# Patient Record
Sex: Female | Born: 1980 | Race: White | Hispanic: Yes | Marital: Married | Smoking: Never smoker
Health system: Southern US, Community
[De-identification: ages and names within clinical notes are randomized; demographics above are authoritative.]

## PROBLEM LIST (undated history)

## (undated) HISTORY — PX: CHOLECYSTECTOMY: SHX55

---

## 2000-12-08 ENCOUNTER — Encounter: Admission: RE | Admit: 2000-12-08 | Discharge: 2000-12-08 | Payer: Self-pay | Admitting: Obstetrics & Gynecology

## 2000-12-29 ENCOUNTER — Encounter: Admission: RE | Admit: 2000-12-29 | Discharge: 2000-12-29 | Payer: Self-pay | Admitting: Obstetrics & Gynecology

## 2001-01-08 ENCOUNTER — Ambulatory Visit (HOSPITAL_COMMUNITY): Admission: RE | Admit: 2001-01-08 | Discharge: 2001-01-08 | Payer: Self-pay | Admitting: Obstetrics & Gynecology

## 2001-01-25 ENCOUNTER — Ambulatory Visit (HOSPITAL_COMMUNITY): Admission: RE | Admit: 2001-01-25 | Discharge: 2001-01-25 | Payer: Self-pay | Admitting: Obstetrics & Gynecology

## 2001-06-02 ENCOUNTER — Ambulatory Visit (HOSPITAL_COMMUNITY): Admission: RE | Admit: 2001-06-02 | Discharge: 2001-06-02 | Payer: Self-pay | Admitting: *Deleted

## 2001-08-12 ENCOUNTER — Ambulatory Visit (HOSPITAL_COMMUNITY): Admission: RE | Admit: 2001-08-12 | Discharge: 2001-08-12 | Payer: Self-pay | Admitting: *Deleted

## 2001-10-29 ENCOUNTER — Ambulatory Visit (HOSPITAL_COMMUNITY): Admission: RE | Admit: 2001-10-29 | Discharge: 2001-10-29 | Payer: Self-pay | Admitting: *Deleted

## 2001-11-02 ENCOUNTER — Inpatient Hospital Stay (HOSPITAL_COMMUNITY): Admission: AD | Admit: 2001-11-02 | Discharge: 2001-11-02 | Payer: Self-pay | Admitting: *Deleted

## 2001-11-02 ENCOUNTER — Encounter: Payer: Self-pay | Admitting: *Deleted

## 2001-11-09 ENCOUNTER — Inpatient Hospital Stay (HOSPITAL_COMMUNITY): Admission: AD | Admit: 2001-11-09 | Discharge: 2001-11-11 | Payer: Self-pay | Admitting: *Deleted

## 2006-07-02 ENCOUNTER — Emergency Department (HOSPITAL_COMMUNITY): Admission: EM | Admit: 2006-07-02 | Discharge: 2006-07-02 | Payer: Self-pay | Admitting: Emergency Medicine

## 2006-11-28 ENCOUNTER — Emergency Department (HOSPITAL_COMMUNITY): Admission: EM | Admit: 2006-11-28 | Discharge: 2006-11-28 | Payer: Self-pay | Admitting: Emergency Medicine

## 2007-12-17 ENCOUNTER — Ambulatory Visit: Payer: Self-pay | Admitting: Gynecology

## 2007-12-20 ENCOUNTER — Ambulatory Visit (HOSPITAL_COMMUNITY): Admission: RE | Admit: 2007-12-20 | Discharge: 2007-12-20 | Payer: Self-pay | Admitting: Obstetrics & Gynecology

## 2007-12-24 ENCOUNTER — Ambulatory Visit: Payer: Self-pay | Admitting: Gynecology

## 2008-08-20 ENCOUNTER — Inpatient Hospital Stay (HOSPITAL_COMMUNITY): Admission: AD | Admit: 2008-08-20 | Discharge: 2008-08-20 | Payer: Self-pay | Admitting: Obstetrics & Gynecology

## 2008-09-28 ENCOUNTER — Ambulatory Visit (HOSPITAL_COMMUNITY): Admission: RE | Admit: 2008-09-28 | Discharge: 2008-09-28 | Payer: Self-pay | Admitting: Family Medicine

## 2008-12-19 ENCOUNTER — Inpatient Hospital Stay (HOSPITAL_COMMUNITY): Admission: AD | Admit: 2008-12-19 | Discharge: 2008-12-21 | Payer: Self-pay | Admitting: Obstetrics & Gynecology

## 2008-12-19 ENCOUNTER — Ambulatory Visit: Payer: Self-pay | Admitting: Family Medicine

## 2009-12-25 ENCOUNTER — Emergency Department (HOSPITAL_COMMUNITY): Admission: EM | Admit: 2009-12-25 | Discharge: 2009-12-25 | Payer: Self-pay | Admitting: Family Medicine

## 2009-12-25 ENCOUNTER — Emergency Department (HOSPITAL_COMMUNITY): Admission: EM | Admit: 2009-12-25 | Discharge: 2009-12-25 | Payer: Self-pay | Admitting: Emergency Medicine

## 2010-06-05 ENCOUNTER — Ambulatory Visit: Payer: Self-pay | Admitting: Internal Medicine

## 2010-06-05 ENCOUNTER — Encounter (INDEPENDENT_AMBULATORY_CARE_PROVIDER_SITE_OTHER): Payer: Self-pay | Admitting: Family Medicine

## 2010-06-05 LAB — CONVERTED CEMR LAB
Albumin: 4.7 g/dL (ref 3.5–5.2)
Basophils Relative: 0 % (ref 0–1)
Calcium: 10.2 mg/dL (ref 8.4–10.5)
Creatinine, Ser: 0.72 mg/dL (ref 0.40–1.20)
Crystals: NONE SEEN
Eosinophils Absolute: 0.2 10*3/uL (ref 0.0–0.7)
Lipase: 15 units/L (ref 0–75)
Lymphs Abs: 3.9 10*3/uL (ref 0.7–4.0)
MCHC: 32.8 g/dL (ref 30.0–36.0)
MCV: 87.4 fL (ref 78.0–100.0)
Neutrophils Relative %: 58 % (ref 43–77)
Platelets: 259 10*3/uL (ref 150–400)
RBC / HPF: NONE SEEN (ref <3)
WBC: 10.8 10*3/uL — ABNORMAL HIGH (ref 4.0–10.5)

## 2010-06-06 ENCOUNTER — Encounter (INDEPENDENT_AMBULATORY_CARE_PROVIDER_SITE_OTHER): Payer: Self-pay | Admitting: Family Medicine

## 2010-07-08 ENCOUNTER — Ambulatory Visit: Payer: Self-pay | Admitting: Internal Medicine

## 2010-07-12 ENCOUNTER — Ambulatory Visit (HOSPITAL_COMMUNITY): Admission: RE | Admit: 2010-07-12 | Discharge: 2010-07-12 | Payer: Self-pay | Admitting: Internal Medicine

## 2010-08-26 ENCOUNTER — Emergency Department (HOSPITAL_COMMUNITY): Admission: EM | Admit: 2010-08-26 | Discharge: 2010-08-26 | Payer: Self-pay | Admitting: Family Medicine

## 2010-10-02 ENCOUNTER — Ambulatory Visit (HOSPITAL_COMMUNITY): Admission: RE | Admit: 2010-10-02 | Discharge: 2010-10-02 | Payer: Self-pay | Admitting: General Surgery

## 2011-02-12 LAB — URINALYSIS, ROUTINE W REFLEX MICROSCOPIC
Bilirubin Urine: NEGATIVE
Ketones, ur: NEGATIVE mg/dL
Leukocytes, UA: NEGATIVE
Nitrite: NEGATIVE
Specific Gravity, Urine: 1.007 (ref 1.005–1.030)
Urobilinogen, UA: 0.2 mg/dL (ref 0.0–1.0)
pH: 6 (ref 5.0–8.0)

## 2011-02-12 LAB — COMPREHENSIVE METABOLIC PANEL
ALT: 14 U/L (ref 0–35)
Alkaline Phosphatase: 41 U/L (ref 39–117)
BUN: 13 mg/dL (ref 6–23)
CO2: 24 mEq/L (ref 19–32)
Calcium: 9.7 mg/dL (ref 8.4–10.5)
GFR calc Af Amer: >60 mL/min (ref >60)
GFR calc non Af Amer: >60 mL/min (ref >60)
Glucose, Bld: 81 mg/dL (ref 70–99)
Total Protein: 7.2 g/dL (ref 6.0–8.3)

## 2011-02-12 LAB — DIFFERENTIAL
Basophils Absolute: 0 10*3/uL (ref 0.0–0.1)
Eosinophils Relative: 2 % (ref 0–5)
Lymphocytes Relative: 30 % (ref 12–46)
Lymphs Abs: 2.2 10*3/uL (ref 0.7–4.0)
Monocytes Absolute: 0.3 10*3/uL (ref 0.1–1.0)
Neutro Abs: 4.6 10*3/uL (ref 1.7–7.7)

## 2011-02-12 LAB — URINE MICROSCOPIC-ADD ON

## 2011-02-12 LAB — CBC
HCT: 39.6 % (ref 36.0–46.0)
Hemoglobin: 13.3 g/dL (ref 12.0–15.0)
RDW: 13.4 % (ref 11.5–15.5)
WBC: 7.3 10*3/uL (ref 4.0–10.5)

## 2011-02-12 LAB — SURGICAL PCR SCREEN
MRSA, PCR: NEGATIVE
Staphylococcus aureus: NEGATIVE

## 2011-02-12 LAB — PREGNANCY, URINE: Preg Test, Ur: NEGATIVE

## 2011-02-13 LAB — LIPASE, BLOOD: Lipase: 21 U/L (ref 11–59)

## 2011-02-13 LAB — POCT URINALYSIS DIPSTICK
Bilirubin Urine: NEGATIVE
Glucose, UA: NEGATIVE mg/dL
Nitrite: NEGATIVE
Urobilinogen, UA: 0.2 mg/dL (ref 0.0–1.0)

## 2011-02-13 LAB — HEPATIC FUNCTION PANEL
ALT: 13 U/L (ref 0–35)
Total Protein: 6.5 g/dL (ref 6.0–8.3)

## 2011-02-13 LAB — AMYLASE: Amylase: 79 U/L (ref 0–105)

## 2011-02-13 LAB — POCT PREGNANCY, URINE: Preg Test, Ur: NEGATIVE

## 2011-02-16 LAB — CBC
HCT: 37.1 % (ref 36.0–46.0)
Hemoglobin: 12.6 g/dL (ref 12.0–15.0)
Platelets: 213 10*3/uL (ref 150–400)
WBC: 12.6 10*3/uL — ABNORMAL HIGH (ref 4.0–10.5)

## 2011-02-16 LAB — DIFFERENTIAL
Eosinophils Absolute: 0.1 10*3/uL (ref 0.0–0.7)
Lymphocytes Relative: 35 % (ref 12–46)
Lymphs Abs: 4.4 10*3/uL — ABNORMAL HIGH (ref 0.7–4.0)
Neutro Abs: 7.4 10*3/uL (ref 1.7–7.7)
Neutrophils Relative %: 59 % (ref 43–77)

## 2011-02-16 LAB — COMPREHENSIVE METABOLIC PANEL
AST: 19 U/L (ref 0–37)
Albumin: 4.1 g/dL (ref 3.5–5.2)
Alkaline Phosphatase: 50 U/L (ref 39–117)
BUN: 13 mg/dL (ref 6–23)
CO2: 22 mEq/L (ref 19–32)
Chloride: 110 mEq/L (ref 96–112)
Creatinine, Ser: 0.6 mg/dL (ref 0.4–1.2)
GFR calc non Af Amer: >60 mL/min (ref >60)
Potassium: 3.7 mEq/L (ref 3.5–5.1)
Total Bilirubin: 0.4 mg/dL (ref 0.3–1.2)

## 2011-02-16 LAB — LIPASE, BLOOD: Lipase: 21 U/L (ref 11–59)

## 2011-03-17 LAB — CBC
HCT: 34.1 % — ABNORMAL LOW (ref 36.0–46.0)
Platelets: 188 10*3/uL (ref 150–400)
RDW: 15.5 % (ref 11.5–15.5)

## 2011-03-17 LAB — RPR: RPR Ser Ql: NONREACTIVE

## 2011-04-15 NOTE — Group Therapy Note (Signed)
NAMELAYCI, STENGLEIN NO.:  [id number]   MEDICAL RECORD NO.:  [id number]          PATIENT TYPE:  WOC   LOCATION:  [facility]                   FACILITY:  [facility]   PHYSICIAN:  Caren Griffins, CNM       DATE OF BIRTH:  1981/02/29   DATE OF SERVICE:                                  CLINIC NOTE   REASON FOR VISIT:  Pelvic pain.   HISTORY:  This is a 30 year old G3, P3 who last delivered 4 years ago.  She reports that since that time she has experienced dyspareunia and  dysmenorrhea which has been gradually worsening.  The dyspareunia is  experienced as insertion pain in the vagina as well as suprapubic pain  that has made intercourse impossible and she is consequently separate  from her partner.  She also experiences pelvic sharp pain that she  describes as not crampy but at times incapacitating during menstruation.  She denies having had dysmenorrhea prior to her deliveries and has had  no surgeries.  She has tried Tylenol only for the pain.  She [facility] [place] [facility].  She  had negative GC and chlamydia at that visit.  She had a negative Pap  smear 2 months ago.  She is interested in IUD for the future.   ALLERGIES:  NONE.   IMMUNIZATIONS:  Usual childhood immunizations.   MENSTRUAL HISTORY:  Is 14 x 28 x 3, heavy with severe pain.   CONTRACEPTIVE HISTORY:  Foam and condoms.   PARTNERS:  None at present.   OB HISTORY:  SVD x3, uncomplicated, at term.  LMP was December 14, 2007.  She is not bleeding now.   GYN HISTORY:  Negative for abnormal Pap or STIs.   SURGERIES:  None.   FAMILY HISTORY:  She has checked off as negative.   PERSONAL MEDICAL HISTORY:  Noncontributory.   SOCIAL HISTORY:  Lives with her children.  She works outside the home.  Negative tobacco, alcohol, drug use.  Denies history of being abused.   REVIEW OF SYSTEMS:  She is positive for fatigue, frequent headaches,  problems with  visual acuity, occasional vomiting.  She denies any  difficulties with urination including dysuria, urgency, frequency,  hematuria.  She also denies bowel problems since she was treated with  Surfak for her constipation.  The pain is unrelieved by bowel movement.  At present she has no other concerns.   PHYSICAL EXAMINATION:  Temperature 97.6, blood pressure 97/72, weight  193.  GENERAL:  Overweight Hispanic female in no apparent distress, normal  affect.  Full physical exam is not repeated as this was done at [facility] visit.  However the bimanual exam and abdominal exam are  done.  ABDOMEN:  Obese and benign, no organomegaly.  She does have tenderness  to light and deep palpation suprapubicly and it is of a diffuse nature.  BIMANUAL:  __________, tanner five, normal rugae.  Cervix is nontender  with cervical motion, however she is tender with insertion of digits.  The cervix is mid position to anterior, feels multiparous and smooth.  Uterus, difficult  to outline due to body habitus but is at least  somewhat retroverted, retroflex and diffusely tender, no enlargement  appreciated, no adnexal masses appreciated however she is also diffusely  tender on the adnexal exam.   ASSESSMENT:  Long standing probable functional pelvic pain,  dysmenorrhea.   PLAN:  Will proceed with pelvic ultrasound to rule out organic reasons.  This could represent endometriosis or pelvic congestion syndrome.  Mirena IUD would be a good choice is pelvic ultrasound is negative and  will proceed with that after the ultrasound.  They are unable to do that  today so schedule for December 20, 2007 at 11:15 and then following that  the patient will return to GYN clinic.           ______________________________  Caren Griffins, CNM     DP/MEDQ  D:  12/17/2007  T:  12/17/2007  Job:  [id number]

## 2011-09-01 LAB — CBC
MCV: 90.4
Platelets: 220
WBC: 9.2

## 2011-09-01 LAB — GC/CHLAMYDIA PROBE AMP, GENITAL: GC Probe Amp, Genital: NEGATIVE

## 2011-09-01 LAB — RPR: RPR Ser Ql: NONREACTIVE

## 2011-09-01 LAB — URINALYSIS, ROUTINE W REFLEX MICROSCOPIC
Hgb urine dipstick: NEGATIVE
Nitrite: NEGATIVE
Protein, ur: NEGATIVE
Urobilinogen, UA: 0.2

## 2011-09-01 LAB — WET PREP, GENITAL
Trich, Wet Prep: NONE SEEN
Yeast Wet Prep HPF POC: NONE SEEN

## 2011-09-01 LAB — HEPATITIS B SURFACE ANTIGEN: Hepatitis B Surface Ag: NEGATIVE

## 2011-09-01 LAB — TYPE AND SCREEN: ABO/RH(D): O POS

## 2011-09-01 LAB — DIFFERENTIAL
Basophils Relative: 0
Eosinophils Absolute: 0
Lymphs Abs: 2.1
Neutrophils Relative %: 71

## 2012-04-27 ENCOUNTER — Emergency Department (HOSPITAL_COMMUNITY)
Admission: EM | Admit: 2012-04-27 | Discharge: 2012-04-28 | Disposition: A | Discharge status: Home / Self Care | Payer: Self-pay | Attending: Emergency Medicine | Admitting: Emergency Medicine

## 2012-04-27 ENCOUNTER — Encounter (HOSPITAL_COMMUNITY): Payer: Self-pay | Admitting: Emergency Medicine

## 2012-04-27 DIAGNOSIS — R110 Nausea: Secondary | ICD-10-CM | POA: Insufficient documentation

## 2012-04-27 DIAGNOSIS — R109 Unspecified abdominal pain: Secondary | ICD-10-CM | POA: Insufficient documentation

## 2012-04-27 LAB — URINALYSIS, ROUTINE W REFLEX MICROSCOPIC
Bilirubin Urine: NEGATIVE
Ketones, ur: NEGATIVE mg/dL
Protein, ur: NEGATIVE mg/dL
Urobilinogen, UA: 0.2 mg/dL (ref 0.0–1.0)

## 2012-04-27 LAB — URINE MICROSCOPIC-ADD ON

## 2012-04-27 NOTE — ED Notes (Signed)
Patient with abdominal pain that started yesterday, having nausea, no vomiting.  No vaginal complaints.

## 2012-04-28 LAB — CBC
HCT: 36.5 % (ref 36.0–46.0)
Hemoglobin: 12.2 g/dL (ref 12.0–15.0)
RDW: 13.3 % (ref 11.5–15.5)
WBC: 11.1 10*3/uL — ABNORMAL HIGH (ref 4.0–10.5)

## 2012-04-28 LAB — POCT I-STAT, CHEM 8
BUN: 18 mg/dL (ref 6–23)
Calcium, Ion: 1.22 mmol/L (ref 1.12–1.32)
Chloride: 107 mEq/L (ref 96–112)
Potassium: 4.1 mEq/L (ref 3.5–5.1)

## 2012-04-28 LAB — WET PREP, GENITAL
Clue Cells Wet Prep HPF POC: NONE SEEN
Trich, Wet Prep: NONE SEEN
Yeast Wet Prep HPF POC: NONE SEEN

## 2012-04-28 LAB — DIFFERENTIAL
Basophils Absolute: 0 10*3/uL (ref 0.0–0.1)
Lymphocytes Relative: 16 % (ref 12–46)
Monocytes Absolute: 0.6 10*3/uL (ref 0.1–1.0)
Monocytes Relative: 6 % (ref 3–12)
Neutro Abs: 8.5 10*3/uL — ABNORMAL HIGH (ref 1.7–7.7)

## 2012-04-28 MED ORDER — OXYCODONE-ACETAMINOPHEN 5-325 MG PO TABS
1.0000 | ORAL_TABLET | Freq: Once | ORAL | Status: AC
  Administered 2012-04-28: 1 via ORAL
  Filled 2012-04-28: qty 1

## 2012-04-28 MED ORDER — CEFTRIAXONE SODIUM 250 MG IJ SOLR
250.0000 mg | Freq: Once | INTRAMUSCULAR | Status: AC
  Administered 2012-04-28: 250 mg via INTRAMUSCULAR
  Filled 2012-04-28: qty 250

## 2012-04-28 MED ORDER — OXYCODONE-ACETAMINOPHEN 5-325 MG PO TABS: 1.0000 | ORAL_TABLET | ORAL | Status: AC | PRN

## 2012-04-28 MED ORDER — LIDOCAINE HCL (PF) 1 % IJ SOLN
INTRAMUSCULAR | Status: AC
  Filled 2012-04-28: qty 5

## 2012-04-28 MED ORDER — DOXYCYCLINE HYCLATE 100 MG PO CAPS: 100.0000 mg | ORAL_CAPSULE | Freq: Two times a day (BID) | ORAL | Status: AC

## 2012-04-28 MED ORDER — ONDANSETRON 4 MG PO TBDP
8.0000 mg | ORAL_TABLET | Freq: Once | ORAL | Status: AC
  Administered 2012-04-28: 8 mg via ORAL
  Filled 2012-04-28: qty 2

## 2012-04-28 NOTE — ED Provider Notes (Signed)
History     CSN: [id number]  Arrival date & time 04/27/12  2121   First MD Initiated Contact with Patient 04/28/12 0054      Chief Complaint  Patient presents with  . Abdominal Pain     Patient is a 31 y.o. female presenting with abdominal pain. The history is provided by the patient and a relative. A language interpreter was used Psychologist, prison and probation services interpreter phone utilized).  Abdominal Pain The primary symptoms of the illness include abdominal pain and nausea. The primary symptoms of the illness do not include fever, vomiting, diarrhea, dysuria, vaginal discharge or vaginal bleeding. The current episode started yesterday. The onset of the illness was gradual. The problem has been gradually worsening.  The patient states that she believes she is currently not pregnant. Additional symptoms associated with the illness include back pain. Symptoms associated with the illness do not include frequency.  pt reports she had headache two days ago, and now with abdominal pain for past 24 hours No fever, no vomiting, no diarrhea is reported She reports she has not experienced these symptoms previously  PMH - none  Past Surgical History  Procedure Date  . Cholecystectomy     History reviewed. No pertinent family history.  History  Substance Use Topics  . Smoking status: Never Smoker   . Smokeless tobacco: Not on file  . Alcohol Use: No    OB History    Grav Para Term Preterm Abortions TAB SAB Ect Mult Living                  Review of Systems  Constitutional: Negative for fever.  Gastrointestinal: Positive for nausea and abdominal pain. Negative for vomiting and diarrhea.  Genitourinary: Negative for dysuria, frequency, vaginal bleeding and vaginal discharge.  Musculoskeletal: Positive for back pain.  All other systems reviewed and are negative.    Allergies  Review of patient's allergies indicates no known allergies.  Home Medications  No current outpatient prescriptions on  file.  BP 109/67  Pulse 75  Temp(Src) 98.8 F (37.1 C) (Oral)  Resp 19  SpO2 100%  LMP 03/31/2012  Physical Exam CONSTITUTIONAL: Well developed/well nourished HEAD AND FACE: Normocephalic/atraumatic EYES: EOMI/PERRL, no scleral icterus ENMT: Mucous membranes moist NECK: supple no meningeal signs SPINE:entire spine nontender CV: S1/S2 noted, no murmurs/rubs/gallops noted LUNGS: Lungs are clear to auscultation bilaterally, no apparent distress ABDOMEN: soft, nontender, no rebound or guarding GU:no cva tenderness String noted at os (IUD), +CMT noted, mild bilateral adnexal tenderness.  Vaginal discharge noted.  No vag bleeding Chaperone present NEURO: Pt is awake/alert, moves all extremitiesx4 EXTREMITIES: pulses normal, full ROM SKIN: warm, color normal PSYCH: no abnormalities of mood noted  ED Course  Procedures   Labs Reviewed  URINALYSIS, ROUTINE W REFLEX MICROSCOPIC - Abnormal; Notable for the following:    Color, Urine STRAW (*)    Hgb urine dipstick TRACE (*)    Leukocytes, UA TRACE (*)    All other components within normal limits  URINE MICROSCOPIC-ADD ON - Abnormal; Notable for the following:    Squamous Epithelial / LPF MANY (*)    Bacteria, UA FEW (*)    All other components within normal limits  CBC - Abnormal; Notable for the following:    WBC 11.1 (*)    All other components within normal limits  DIFFERENTIAL - Abnormal; Notable for the following:    Neutro Abs 8.5 (*)    All other components within normal limits  POCT PREGNANCY, URINE  POCT I-STAT, CHEM 8  GC/CHLAMYDIA PROBE AMP, GENITAL  WET PREP, GENITAL   1:23 AM Pt well appearing, abdomen soft on my initial exam 2:17 AM Pt with vag discharge and adnexal tenderness Will treat for presumptive PID.  Pt is otherwise well appearing and nontoxic She reports only one sexual partner and GC/Chlam studies pending at this time.   I advised to f/u on these results as outpatient Given strict return  precautions     MDM  Nursing notes reviewed and considered in documentation All labs/vitals reviewed and considered interpreter utilized        Joya Gaskins, MD 04/28/12 [phone]

## 2012-04-28 NOTE — Discharge Instructions (Signed)
Dolor abdominal, versin ampliada (Abdominal Pain, Nonspecific) El anlisis podra no mostrar la razn exacta por la que tiene dolor abdominal. Debido a que hay muchas causas distintas de dolor abdominal, se podr necesitar otro control y ms anlisis. Es muy importante el seguimiento para observar los sntomas duraderos (persistentes) o los que empeoran. Una causa posible de dolor abdominal en cualquier persona que an tiene su apndice es la apendicitis aguda. La apendicitis es a menudo difcil de diagnosticar. Los anlisis de sangre, orina, ultrasonido y tomografa computada no pueden descartar por completo la apendicitis u otra causas de dolor abdominal. A veces, slo los cambios que se producen a travs del tiempo permitirn determinar si el dolor abdominal se debe al apendicitis o a otras causas. Otros problemas potenciales que pueden requerir ciruga tambin pueden tomar algn tiempo hasta ser evidentes. Debido a esto, es importante seguir todas las instrucciones de ms abajo. INSTRUCCIONES PARA EL CUIDADO DOMICILIARIO  Descanse todo lo que pueda.   No ingiera alimentos slidos hasta que el dolor desaparezca.   Cuando un adulto o un nio siente dolor: Puede beneficiarlo una dieta basada en agua, t liviano descafeinado, caldo o consom, gelatina, solucin de rehidratacin oral, helados de agua o trocitos de hielo.   Cuando el adulto o el nio no sienten ms dolor: Consuma una dieta liviana (tostadas secas, crackers, jugo de manzana o arroz blanco). Incorpore ms alimentos lentamente, siempre que esto no le cause ningn trastorno. No consuma productos lcteos (incluyendo queso y huevos) ni ingiera alimentos condimentados, grasos, fritos o con gran cantidad de fibra.   No consuma alcohol, cafena ni cigarrillos.   Tome sus medicamentos regularmente, excepto que el profesional le indique lo contrario.   Utilice los medicamentos de venta libre o de prescripcin para el dolor, el malestar o la  fiebre, segn se lo indique el profesional que lo asiste.   Utilice los medicamentos de venta libre o de prescripcin para el dolor, el malestar o la fiebre, segn se lo indique el profesional que lo asiste. No administre aspirina a los nios.  Si el mdico le ha dado fecha para una visita de control, es importante que concurra. No cumplir con este control puede dar como resultado que el dao, el dolor o la discapacidad sean permanentes (crnicos). Si tiene problemas para asistir al control, deber comunicarlo en este establecimiento para recibir asesoramiento.  SOLICITE ATENCIN MDICA DE INMEDIATO SI:  Usted o su nio han sufrido dolor por ms de 24 horas.   El dolor empeora, cambia de lugar o se siente diferente.   Usted o su nio tienen una temperatura oral de ms de 102 F (38.9 C) y no puede ser controlada con medicamentos.   Su beb tiene ms de 3 meses y su temperatura rectal es de 102 F (38.9 C) o ms.   Su beb tiene 3 meses o menos y su temperatura rectal es de 100.4 F (38 C) o ms.   Usted o su hijo tienen escalofros.   Continan con vmitos y no pueden retener lquidos.   Observa sangre en el vmito o en la materia fecal.   Las heces son oscuras o negras.   Los movimientos intestinales son frecuentes.   Los movimientos intestinales se detienen (hay una obstruccin) o no pueden eliminarse los gases.   Siente dolor al orinar o lo hace con frecuencia u observa sangre en la orina.   La piel y la zona blanca de los ojos cambian de color y se tornan amarillos.     Observa que el estmago se hincha o est ms grande.   Sienten mareos o desmayos.   Sienten dolor en el pecho o la espalda.  EST SEGURO QUE:   Comprende las instrucciones para el alta mdica.   Controlar su enfermedad.   Solicitar atencin mdica de inmediato segn las indicaciones.  Document Released: 02/24/2008 Document Revised: 11/06/2011 [employer] Patient Information 2012 [employer]. 

## 2012-04-29 LAB — GC/CHLAMYDIA PROBE AMP, GENITAL: GC Probe Amp, Genital: NEGATIVE

## 2012-04-30 ENCOUNTER — Encounter (HOSPITAL_COMMUNITY): Payer: Self-pay

## 2012-04-30 ENCOUNTER — Emergency Department (INDEPENDENT_AMBULATORY_CARE_PROVIDER_SITE_OTHER)
Admission: EM | Admit: 2012-04-30 | Discharge: 2012-04-30 | Disposition: A | Discharge status: Home Health | Payer: Self-pay | Source: Home / Self Care | Attending: Family Medicine | Admitting: Family Medicine

## 2012-04-30 DIAGNOSIS — K2990 Gastroduodenitis, unspecified, without bleeding: Secondary | ICD-10-CM

## 2012-04-30 DIAGNOSIS — K2970 Gastritis, unspecified, without bleeding: Secondary | ICD-10-CM

## 2012-04-30 LAB — COMPREHENSIVE METABOLIC PANEL
AST: 15 U/L (ref 0–37)
CO2: 26 mEq/L (ref 19–32)
Calcium: 10 mg/dL (ref 8.4–10.5)
Creatinine, Ser: 1.17 mg/dL — ABNORMAL HIGH (ref 0.50–1.10)
GFR calc Af Amer: 71 mL/min — ABNORMAL LOW (ref >90)
GFR calc non Af Amer: 61 mL/min — ABNORMAL LOW (ref >90)
Glucose, Bld: 80 mg/dL (ref 70–99)

## 2012-04-30 LAB — POCT PREGNANCY, URINE: Preg Test, Ur: NEGATIVE

## 2012-04-30 LAB — POCT URINALYSIS DIP (DEVICE)
Bilirubin Urine: NEGATIVE
Ketones, ur: NEGATIVE mg/dL
Leukocytes, UA: NEGATIVE

## 2012-04-30 LAB — CBC
Hemoglobin: 12.1 g/dL (ref 12.0–15.0)
MCH: 29.3 pg (ref 26.0–34.0)
MCHC: 33.2 g/dL (ref 30.0–36.0)

## 2012-04-30 LAB — LIPASE, BLOOD: Lipase: 10 U/L — ABNORMAL LOW (ref 11–59)

## 2012-04-30 MED ORDER — SUCRALFATE 1 GM/10ML PO SUSP: 1.0000 g | Freq: Four times a day (QID) | ORAL | Status: DC

## 2012-04-30 MED ORDER — DICYCLOMINE HCL 20 MG PO TABS: 20.0000 mg | ORAL_TABLET | Freq: Two times a day (BID) | ORAL | Status: DC

## 2012-04-30 MED ORDER — OMEPRAZOLE 20 MG PO CPDR: DELAYED_RELEASE_CAPSULE | ORAL | Status: DC

## 2012-04-30 NOTE — ED Notes (Signed)
Pt c/o generalized abdominal pain for past 2 weeks.  Pt states she has been seen [facility] for same.  Pt states pain increases after eating or drinking, denies fever.  Pt states she has felt nauseated, denies emesis, diarrhea.  Pt denies any urinary SX, no dysuria, increased frequency or hematuria.

## 2012-04-30 NOTE — Discharge Instructions (Signed)
Mi impresion es que tiene usted una irritacion del estomago o posible ulcera. Sus analisis de sangre estan normales [place]. aun esta pendiente su examen sobre la funcion del pancreas. Si algun examen llegara anormal le contactariamos y le dariamos instrucciones a seguir. Tome los [employer] he prescrito. si continua con sintomas llame para hacer una cita con el gastro enterologo. (numero Tomasita CrumbleLaurena Bering al departamento de emergencia si empeora su dolor.

## 2012-04-30 NOTE — ED Provider Notes (Signed)
History     CSN: [id number]  Arrival date & time 04/30/12  1521   First MD Initiated Contact with Patient 04/30/12 1608      Chief Complaint  Patient presents with  . Abdominal Pain    (Consider location/radiation/quality/duration/timing/severity/associated sxs/prior treatment) HPI Comments: 31 year old female nonsmoker with a history of cholecystectomy 2 years ago. Comes complaining of generalized abdominal pain for 5 days. She was seen in the emergency department on May 29. Had normal electrolytes and CBC with differential had a vaginal discharge on pelvic examination and was diagnosed with a possible pelvic inflammatory disease. Currently taking doxycycline. Patient is a poor historian and I am interviewing her in her primary language (Spanish) stated her pain is constant diffuse with radiation to back bilaterally. Now associated with nausea and one episode of emesis this morning. States her pain gets worse with eating solids or drinking fluids. Also associated with decreased appetite. Was able to eat cereal today. Denies melena or red blood with stools. Last bowel movement 3 days ago normal brown soft. Pain not changed with bowel movement. Denies fever, chills dysuria or hematuria. No headache or dizziness. No skin rashes. Patient works as a Advertising copywriter. State her pain is worse and radiates to the back with bending and straining during her job.    History reviewed. No pertinent past medical history.  Past Surgical History  Procedure Date  . Cholecystectomy     No family history on file.  History  Substance Use Topics  . Smoking status: Never Smoker   . Smokeless tobacco: Not on file  . Alcohol Use: No    OB History    Grav Para Term Preterm Abortions TAB SAB Ect Mult Living                  Review of Systems  Constitutional: Negative for fever and chills.  HENT: Negative for sore throat.   Respiratory: Negative for cough and shortness of breath.   Cardiovascular:  Negative for chest pain.  Gastrointestinal: Positive for nausea, vomiting and abdominal pain. Negative for diarrhea, constipation and blood in stool.       No melena.  Genitourinary: Negative for dysuria, frequency, hematuria, flank pain, vaginal bleeding, vaginal discharge, pelvic pain and dyspareunia.  Skin: Negative for rash.  Neurological: Negative for dizziness and headaches.    Allergies  Review of patient's allergies indicates no known allergies.  Home Medications   Current Outpatient Rx  Name Route Sig Dispense Refill  . DOXYCYCLINE HYCLATE 100 MG PO CAPS Oral Take 1 capsule (100 mg total) by mouth 2 (two) times daily. 28 capsule 0  . OXYCODONE-ACETAMINOPHEN 5-325 MG PO TABS Oral Take 1 tablet by mouth every 4 (four) hours as needed for pain. 10 tablet 0  . DICYCLOMINE HCL 20 MG PO TABS Oral Take 1 tablet (20 mg total) by mouth 2 (two) times daily. 20 tablet 0  . OMEPRAZOLE 20 MG PO CPDR  Take 1 tab daily bid for 1 week then take daily. Take at least 1 hour apart from sucralfate. 30 capsule 0  . SUCRALFATE 1 GM/10ML PO SUSP Oral Take 10 mLs (1 g total) by mouth 4 (four) times daily. Take 30 min before meals and at bed time 420 mL 0    BP 108/73  Pulse 63  Temp(Src) 98.3 F (36.8 C) (Oral)  Resp 16  SpO2 100%  LMP 03/31/2012  Physical Exam  Nursing note and vitals reviewed. Constitutional: She is oriented to person, place, and  time. She appears well-developed and well-nourished. No distress.  HENT:  Head: Normocephalic and atraumatic.  Mouth/Throat: Oropharynx is clear and moist.  Eyes: Conjunctivae are normal. No scleral icterus.  Neck: Neck supple. No thyromegaly present.  Cardiovascular: Normal heart sounds.   Pulmonary/Chest: Breath sounds normal.  Abdominal: Soft. Bowel sounds are normal. She exhibits no distension and no mass. There is no rebound and no guarding.       Reported diffused tenderness worse in epigastric area. No bruits.  No CVT.  Lymphadenopathy:     She has no cervical adenopathy.  Neurological: She is alert and oriented to person, place, and time.  Skin: No rash noted.    ED Course  Procedures (including critical care time)  Labs Reviewed  LIPASE, BLOOD - Abnormal; Notable for the following:    Lipase 10 (*)    All other components within normal limits  COMPREHENSIVE METABOLIC PANEL - Abnormal; Notable for the following:    Creatinine, Ser 1.17 (*)    GFR calc non Af Amer 61 (*)    GFR calc Af Amer 71 (*)    All other components within normal limits  POCT URINALYSIS DIP (DEVICE) - Abnormal; Notable for the following:    Hgb urine dipstick SMALL (*)    All other components within normal limits  CBC  POCT PREGNANCY, URINE   No results found.   1. Gastritis       MDM  Date description of symptoms. No findings suggestive of acute abdomen. Some symptoms suggestive of possible gastritis. Stable hemoglobin. Normal CBC, urinalysis with small blood the patient had recent pelvic instrumentation no urinary symptoms. Normal lipase and metabolic panel. Decided to treat with sucralfate and PPI. Also prescribe Bentyl for possible irritable bowel although no classic presentation. Asked to followup with the GI specialist if persistent symptoms. Or go to the emergency department if worsening constant pain or not keeping fluids down.  Sharin Grave, MD 05/02/12 [phone]

## 2012-06-23 ENCOUNTER — Ambulatory Visit: Payer: Self-pay | Admitting: Internal Medicine

## 2012-06-23 DIAGNOSIS — Z0289 Encounter for other administrative examinations: Secondary | ICD-10-CM

## 2012-07-14 IMAGING — US US ABDOMEN COMPLETE
1 series · 14 of 25 positions shown · non-contrast
Comparison: Abdominal ultrasound 07/02/2006.

CLINICAL DATA: Epigastric pain.

COMPLETE ABDOMINAL ULTRASOUND

[Series 1: us abdomen complete · 0.30mm/px · 14 of 76 slices shown]
[im 1/76]
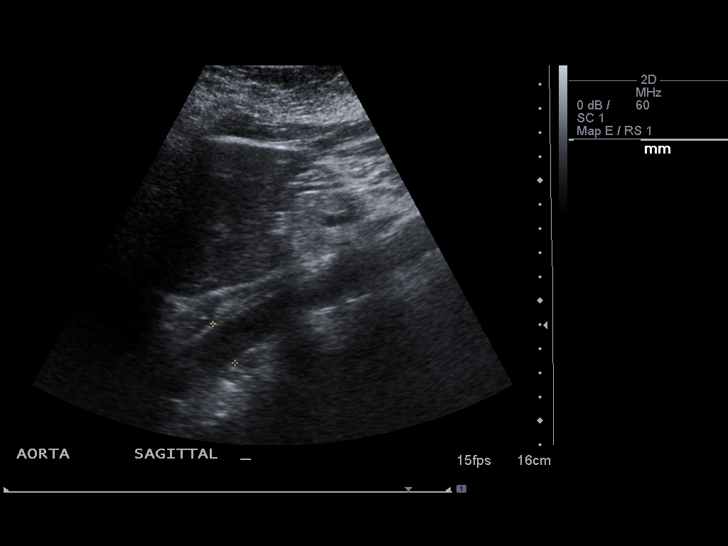
[im 7/76]
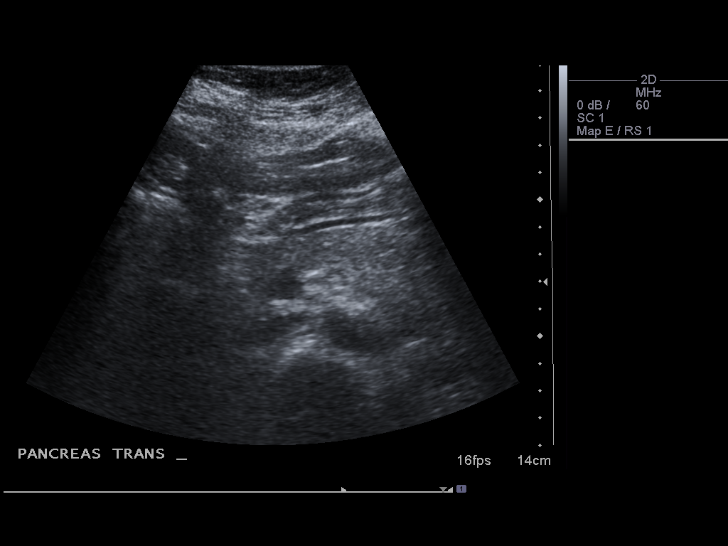
[im 13/76]
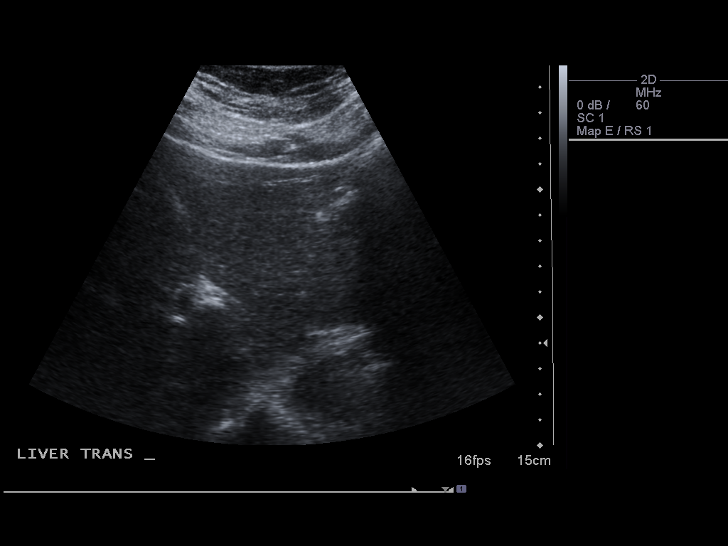
[im 19/76]
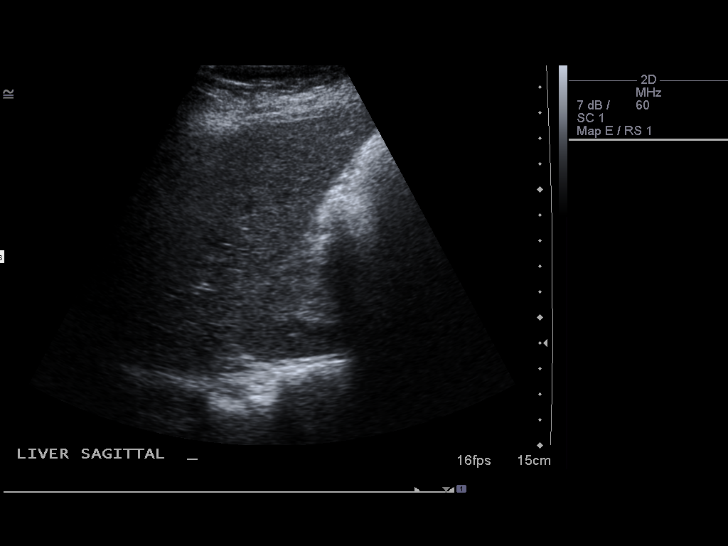
[im 26/76]
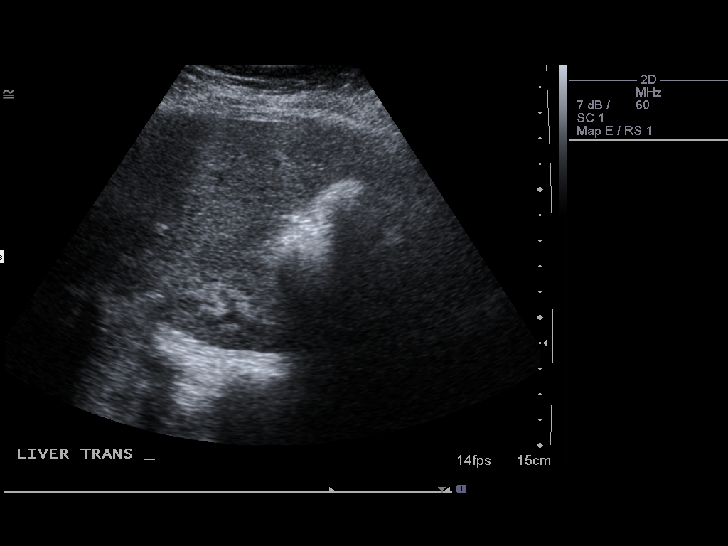
[im 29/76]
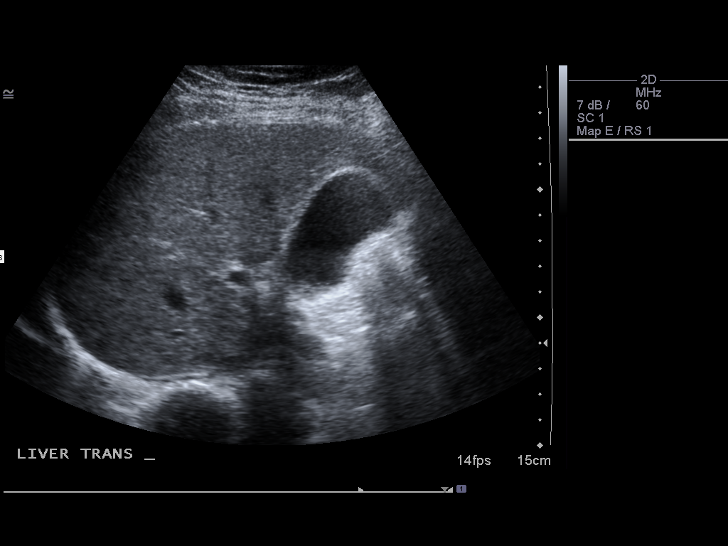
[im 35/76]
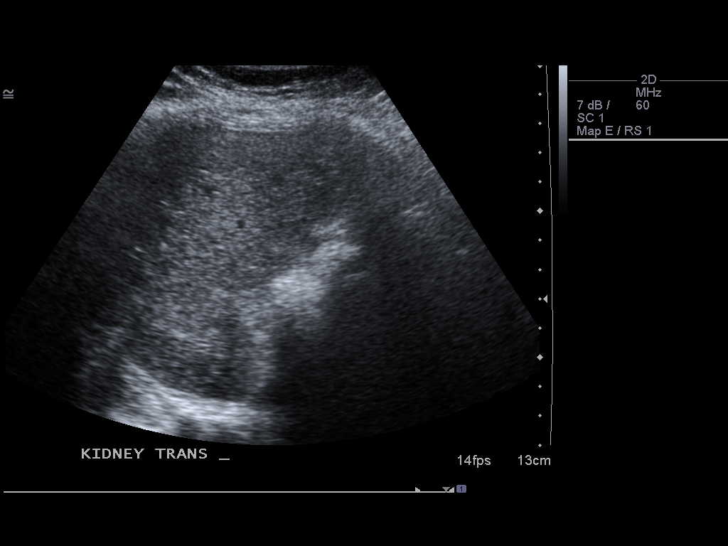
[im 41/76]
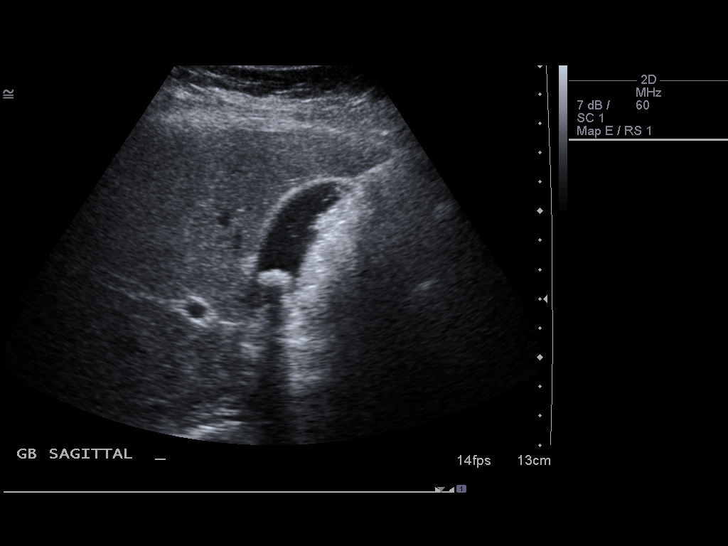
[im 47/76]
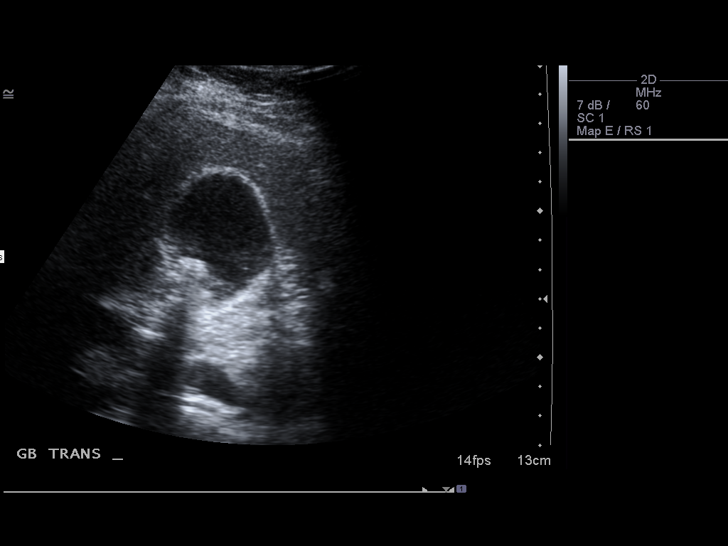
[im 51/76]
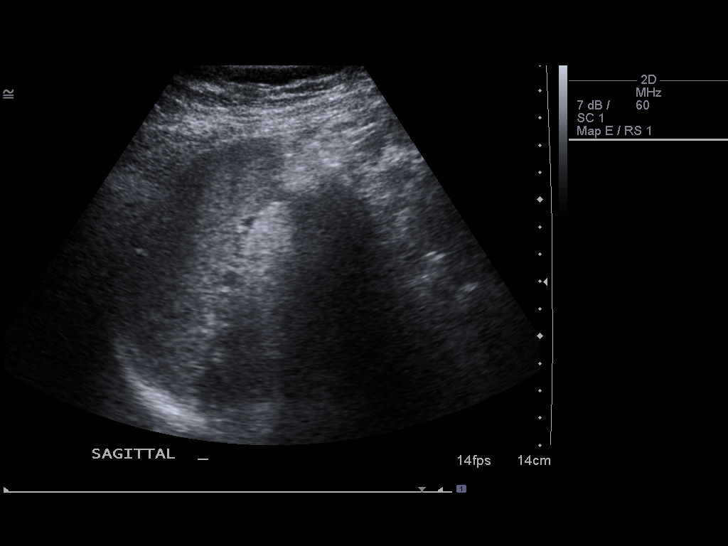
[im 57/76]
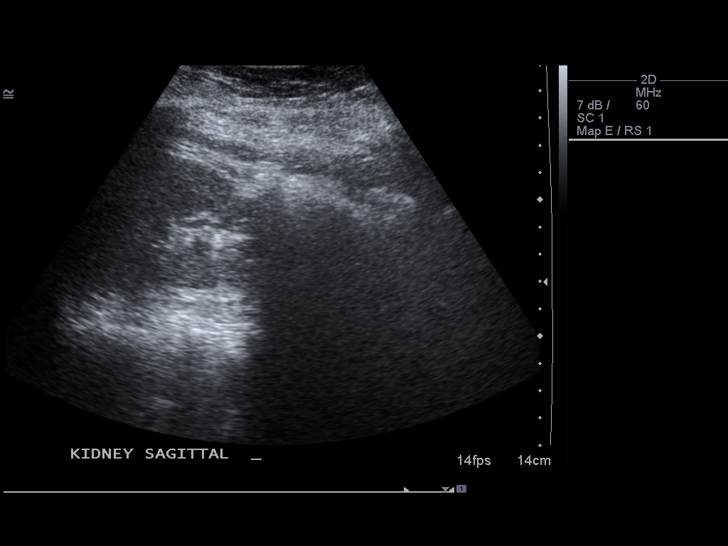
[im 63/76]
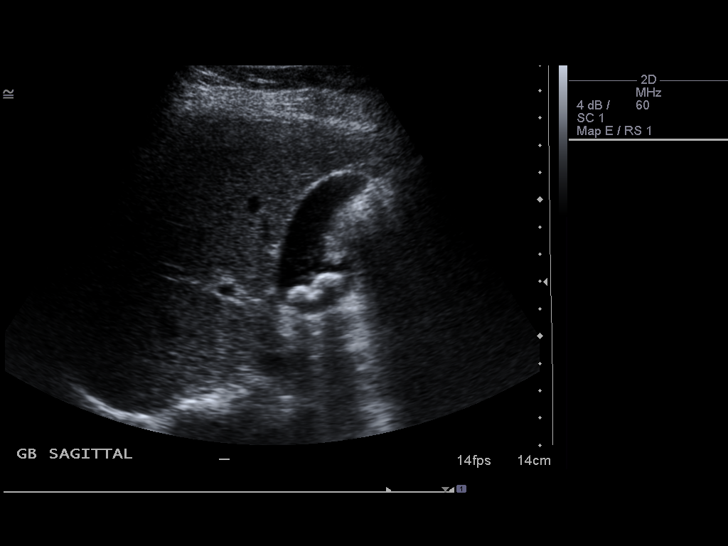
[im 69/76]
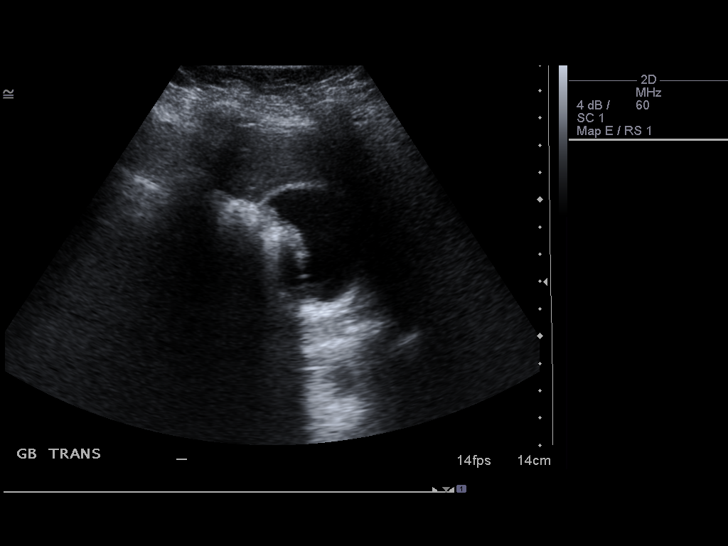
[im 76/76]
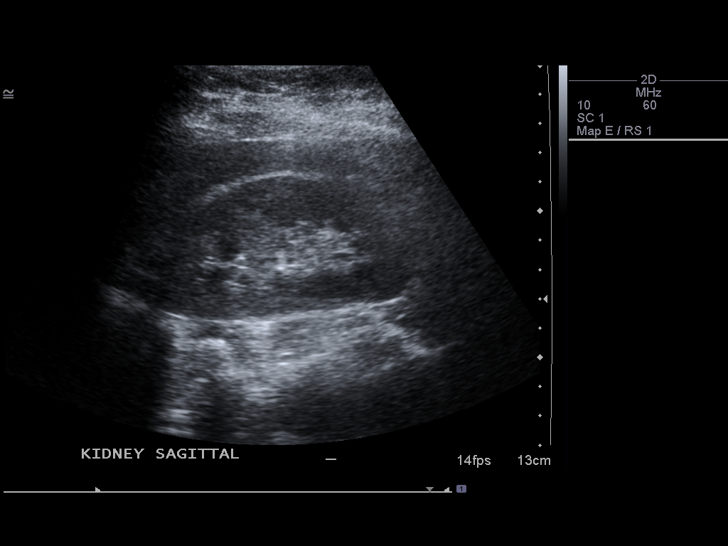

[14 of 25 positions shown; findings below may reference images not displayed]

FINDINGS: Gallbladder:  [DATE] stones identified the gallbladder measuring up
to 1.2 cm.  There is no gallbladder wall thickening or
pericholecystic fluid. Sonographer reports negative Murphy's sign.

Common bile duct:  5.2 mm.

Liver:  No focal lesion identified.  Within normal limits in
parenchymal echogenicity.

IVC:  Appears normal.

Pancreas:  No focal abnormality seen.

Spleen:  Measures 6.2 cm and appears normal.

Right Kidney:  Measures 10.1 cm and appears normal.

Left Kidney:  Measures 11.0 cm and appears normal.

Abdominal aorta:  No aneurysm identified.
IMPRESSION: 2-3 gallstones.  No evidence of cholecystitis.

## 2016-02-21 ENCOUNTER — Encounter (HOSPITAL_COMMUNITY): Payer: Self-pay | Admitting: Emergency Medicine

## 2016-02-21 ENCOUNTER — Emergency Department (HOSPITAL_COMMUNITY)
Admission: EM | Admit: 2016-02-21 | Discharge: 2016-02-21 | Disposition: A | Discharge status: Home / Self Care | Payer: Self-pay | Source: Home / Self Care

## 2016-02-21 ENCOUNTER — Emergency Department (HOSPITAL_COMMUNITY)
Admission: EM | Admit: 2016-02-21 | Discharge: 2016-02-22 | Disposition: A | Discharge status: Home / Self Care | Payer: Self-pay | Attending: Emergency Medicine | Admitting: Emergency Medicine

## 2016-02-21 DIAGNOSIS — R42 Dizziness and giddiness: Secondary | ICD-10-CM | POA: Insufficient documentation

## 2016-02-21 DIAGNOSIS — R519 Headache, unspecified: Secondary | ICD-10-CM

## 2016-02-21 DIAGNOSIS — Z3202 Encounter for pregnancy test, result negative: Secondary | ICD-10-CM | POA: Insufficient documentation

## 2016-02-21 DIAGNOSIS — J3489 Other specified disorders of nose and nasal sinuses: Secondary | ICD-10-CM | POA: Insufficient documentation

## 2016-02-21 DIAGNOSIS — R51 Headache: Secondary | ICD-10-CM | POA: Insufficient documentation

## 2016-02-21 MED ORDER — DIPHENHYDRAMINE HCL 25 MG PO CAPS
25.0000 mg | ORAL_CAPSULE | Freq: Once | ORAL | Status: AC
  Administered 2016-02-22: 25 mg via ORAL
  Filled 2016-02-21: qty 1

## 2016-02-21 MED ORDER — PROCHLORPERAZINE MALEATE 10 MG PO TABS
10.0000 mg | ORAL_TABLET | Freq: Once | ORAL | Status: AC
  Administered 2016-02-22: 10 mg via ORAL
  Filled 2016-02-21: qty 1

## 2016-02-21 NOTE — ED Notes (Signed)
Called for triage x1.

## 2016-02-21 NOTE — ED Notes (Signed)
Pt from home with c/o headache x 2 weeks worsening today.  Pt denies light sensitivity, N/V.  Reports pressure over forehead and eyes.  NAD, A&O.

## 2016-02-21 NOTE — ED Notes (Signed)
Patient transported to CT 

## 2016-02-21 NOTE — ED Provider Notes (Signed)
CSN: [id number]     Arrival date & time 02/21/16  1707 History  By signing my name below, I, Ronney Lion, attest that this documentation has been prepared under the direction and in the presence of Alvira Monday, MD. Electronically Signed: Ronney Lion, ED Scribe. 02/21/2016. 11:57 PM.   Chief Complaint  Patient presents with  . Headache   Patient is a 35 y.o. female presenting with headaches. The history is provided by the patient. A language interpreter was used (relative occasionally acted as Equities trader).  Headache Radiates to:  Does not radiate Onset quality:  Gradual Duration:  2 weeks Timing:  Constant Progression:  Worsening Chronicity:  New Relieved by:  Nothing Worsened by:  Nothing Ineffective treatments: ibuprofen. Associated symptoms: cough and dizziness   Associated symptoms: no abdominal pain, no back pain, no fever, no myalgias, no nausea, no neck pain, no numbness, no photophobia, no sore throat, no vomiting and no weakness     HPI Comments: Alvira Hecht is a 35 y.o. female who presents to the Emergency Department complaining of a gradual-onset, constant, gradually worsening, headache that began 2 weeks ago, which worsened today. Patient denies any traumas, falls, or injuries. She does note associated cough, rhinorrhea, and room-spinning dizziness exacerbated by eye movement. Changing head positions does not affect her dizziness. Patient states she has tried ibuprofen at home with no relief. This is a new problem; patient denies a history of frequent headaches. She also denies a history of any chronic medical conditions. She denies fever, generalized myalgias, nausea, vomiting, unilateral weakness, difficulty talking, difficulty walking, photophobia, or phonophobia. Patient has known allergies to Naproxen.   History reviewed. No pertinent past medical history. Past Surgical History  Procedure Laterality Date  . Cholecystectomy     History reviewed. No pertinent  family history. Social History  Substance Use Topics  . Smoking status: Never Smoker   . Smokeless tobacco: None  . Alcohol Use: No   OB History    No data available     Review of Systems  Constitutional: Negative for fever.  HENT: Positive for rhinorrhea. Negative for sore throat.   Eyes: Negative for photophobia and visual disturbance.  Respiratory: Positive for cough. Negative for shortness of breath.   Cardiovascular: Negative for chest pain.  Gastrointestinal: Negative for nausea, vomiting and abdominal pain.  Genitourinary: Negative for difficulty urinating.  Musculoskeletal: Negative for myalgias, back pain, gait problem and neck pain.  Skin: Negative for rash.  Neurological: Positive for dizziness and headaches. Negative for syncope, facial asymmetry, speech difficulty, weakness and numbness.  All other systems reviewed and are negative.     Allergies  Naproxen  Home Medications   Prior to Admission medications   Medication Sig Start Date End Date Taking? Authorizing Provider  dicyclomine (BENTYL) 20 MG tablet Take 1 tablet (20 mg total) by mouth 2 (two) times daily. Patient not taking: Reported on 02/21/2016 04/30/12 04/30/13  Christin Fudge Moreno-Coll, MD  omeprazole (PRILOSEC) 20 MG capsule Take 1 tab daily bid for 1 week then take daily. Take at least 1 hour apart from sucralfate. Patient not taking: Reported on 02/21/2016 04/30/12   Sharin Grave, MD  prochlorperazine (COMPAZINE) 10 MG tablet Take 1 tablet (10 mg total) by mouth 2 (two) times daily as needed for nausea or vomiting (headache). Take this medication with  of benadryl from over the counter 02/22/16   Alvira Monday, MD  sucralfate (CARAFATE) 1 GM/10ML suspension Take 10 mLs (1 g total) by mouth 4 (four) times daily.  Take 30 min before meals and at bed time Patient not taking: Reported on 02/21/2016 04/30/12 05/30/12  Adlih Moreno-Coll, MD   BP 107/76 mmHg  Pulse 62  Temp(Src) 97.9 F (36.6 C) (Oral)   Resp 16  SpO2 98%  LMP 01/30/2016 (Approximate) Physical Exam  Constitutional: She is oriented to person, place, and time. She appears well-developed and well-nourished. No distress.  HENT:  Head: Normocephalic and atraumatic.  Eyes: Conjunctivae and EOM are normal.  Neck: Normal range of motion.  Cardiovascular: Normal rate, regular rhythm, normal heart sounds and intact distal pulses.  Exam reveals no gallop and no friction rub.   No murmur heard. Pulmonary/Chest: Effort normal and breath sounds normal. No respiratory distress. She has no wheezes. She has no rales.  Abdominal: Soft. She exhibits no distension. There is no tenderness. There is no guarding.  Musculoskeletal: She exhibits no edema or tenderness.  Neurological: She is alert and oriented to person, place, and time. She has normal strength. No cranial nerve deficit or sensory deficit. She displays a negative Romberg sign. Coordination and gait normal. GCS eye subscore is 4. GCS verbal subscore is 5. GCS motor subscore is 6.  Skin: Skin is warm and dry. No rash noted. She is not diaphoretic. No erythema.  Nursing note and vitals reviewed.   ED Course  Procedures (including critical care time)  DIAGNOSTIC STUDIES: Oxygen Saturation is 99% on RA, normal by my interpretation.    COORDINATION OF CARE: 11:40 PM - Discussed treatment plan with pt and her relative at bedside which includes CT scan of head. Will also administer headache cocktail. Pt states she is not driving home today. Pt and her relative verbalized understanding and agreed to plan.   Labs Review Labs Reviewed  POC URINE PREG, ED    Imaging Review Ct Head Wo Contrast  02/22/2016  CLINICAL DATA:  Constant headache and dizziness for 2 weeks. EXAM: CT HEAD WITHOUT CONTRAST TECHNIQUE: Contiguous axial images were obtained from the base of the skull through the vertex without intravenous contrast. COMPARISON:  11/28/2006 FINDINGS: Ventricles and sulci are  symmetrical. No ventricular dilatation. No mass effect or midline shift. No abnormal extra-axial fluid collections. Gray-white matter junctions are distinct. Basal cisterns are not effaced. No evidence of acute intracranial hemorrhage. No depressed skull fractures. Visualized paranasal sinuses and mastoid air cells are not opacified. IMPRESSION: No acute intracranial abnormalities. Electronically Signed   By: Burman NievesWilliam  Stevens M.D.   On: 02/22/2016 00:13   I have personally reviewed and evaluated these images and lab results as part of my medical decision-making.   MDM   Final diagnoses:  Acute nonintractable headache, unspecified headache type   34yo female with no significant medical history presents with concern of headache.  Headache began slowly, no trauma, no fevers, and normal neurologic exam and have low suspicion for [facility], SDH or meningitis.  Given dizziness with HA, screening head CT was done which was within normal limits. Patient was given compazine and benadryl with improvement in headache.  Pt also with cough, nasal congestion, likely viral URI. Patient discharged in stable condition with understanding of reasons to return.     I personally performed the services described in this documentation, which was scribed in my presence. The recorded information has been reviewed and is accurate.     Alvira MondayErin Meldrick Buttery, MD 02/22/16 2038

## 2016-02-22 ENCOUNTER — Emergency Department (HOSPITAL_COMMUNITY): Payer: Self-pay

## 2016-02-22 LAB — POC URINE PREG, ED: PREG TEST UR: NEGATIVE

## 2016-02-22 MED ORDER — PROCHLORPERAZINE MALEATE 10 MG PO TABS: 10.0000 mg | ORAL_TABLET | Freq: Two times a day (BID) | ORAL | Status: DC | PRN

## 2016-02-22 NOTE — Discharge Instructions (Signed)
Dolor de cabeza general sin causa °(General Headache Without Cause) °El dolor de cabeza es un dolor o malestar que se siente en la zona de la cabeza o del cuello. Puede no tener una causa específica. Hay muchas causas y tipos de dolores de cabeza. Los dolores de cabeza más comunes son los siguientes: °· Cefalea tensional. °· Cefaleas migrañosas. °· Cefalea en brotes. °· Cefaleas diarias crónicas. °INSTRUCCIONES PARA EL CUIDADO EN EL HOGAR  °Controle su afección para ver si hay cambios. Siga estos pasos para controlar la afección: °Control del dolor °· Tome los medicamentos de venta libre y los recetados solamente como se lo haya indicado el médico. °· Cuando sienta dolor de cabeza acuéstese en un cuarto oscuro y tranquilo. °· Si se lo indican, aplique hielo sobre la cabeza y la zona del cuello: °¨ Ponga el hielo en una bolsa plástica. °¨ Coloque una toalla entre la piel y la bolsa de hielo. °¨ Coloque el hielo durante 20 minutos, 2 a 3 veces por día. °· Utilice una almohadilla térmica o tome una ducha con agua caliente para aplicar calor en la cabeza y la zona del cuello como se lo haya indicado el médico. °· Mantenga las luces tenues si le molesta las luces brillantes o sus dolores de cabeza empeoran. °Comida y bebida °· Mantenga un horario para las comidas. °· Limite el consumo de bebidas alcohólicas. °· Consuma menos cantidad de cafeína o deje de tomarla. °Instrucciones generales °· Concurra a todas las visitas de control como se lo haya indicado el médico. Esto es importante. °· Lleve un diario de los dolores de cabeza para averiguar qué factores pueden desencadenarlos. Por ejemplo, escriba los siguientes datos: °¨ Lo que usted come y bebe. °¨ Cuánto tiempo duerme. °¨ Algún cambio en su dieta o en los medicamentos. °· Pruebe algunas técnicas de relajación, como los masajes. °· Limite el estrés. °· Siéntese con la espalda recta y no tense los músculos. °· No consuma productos que contengan tabaco, incluidos  cigarrillos, tabaco de mascar o cigarrillos electrónicos. Si necesita ayuda para dejar de fumar, consulte al médico. °· Haga actividad física habitualmente como se lo haya indicado el médico. °· Tenga un horario fijo para dormir. Duerma entre 7 y 9 horas o la cantidad de horas que le haya recomendado el médico. °SOLICITE ATENCIÓN MÉDICA SI:  °· Los medicamentos no logran aliviar los síntomas. °· Tiene un dolor de cabeza que es diferente del dolor de cabeza habitual. °· Tiene náuseas o vómitos. °· Tiene fiebre. °SOLICITE ATENCIÓN MÉDICA DE INMEDIATO SI:  °· El dolor se hace cada vez más intenso. °· Ha vomitado repetidas veces. °· Presenta rigidez en el cuello. °· Sufre pérdida de la visión. °· Tiene problemas para hablar. °· Siente dolor en el ojo o en el oído. °· Presenta debilidad muscular o pérdida del control muscular. °· Pierde el equilibrio o tiene problemas para caminar. °· Sufre mareos o se desmaya. °· Se siente confundido. °  °Esta información no tiene como fin reemplazar el consejo del médico. Asegúrese de hacerle al médico cualquier pregunta que tenga. °  °Document Released: 08/27/2005 Document Revised: 08/08/2015 °Elsevier Interactive Patient Education ©2016 [employer] ° °

## 2018-02-24 IMAGING — CT CT HEAD W/O CM
2 series · 15 of 30 positions shown, 17 images · non-contrast
Comparison: 11/28/2006

CLINICAL DATA: Constant headache and dizziness for 2 weeks.

EXAM:
CT HEAD WITHOUT CONTRAST
TECHNIQUE: Contiguous axial images were obtained from the base of the skull
through the vertex without intravenous contrast.

[Series 3: head without · axial · non-contrast · 0.39mm/px · z∈[+1232,+1347]mm · 7 of 31 slices shown, 9 images]
[im 4/31  brain]
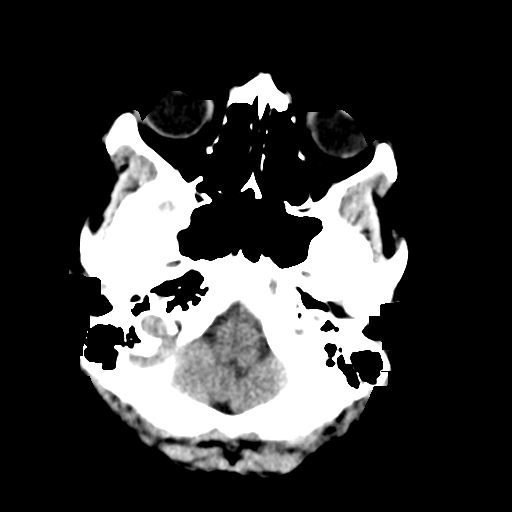
[im 4/31  bone]
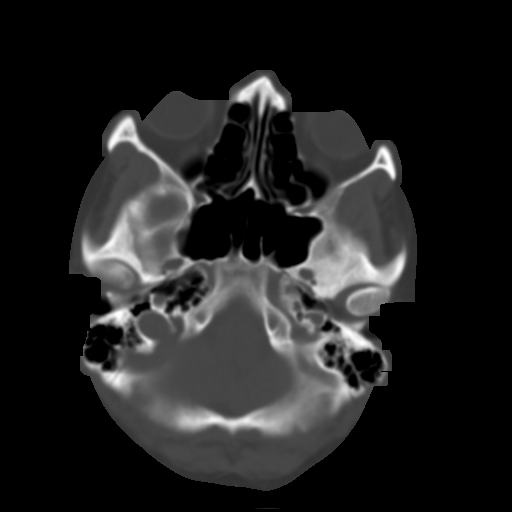
[im 8/31  brain]
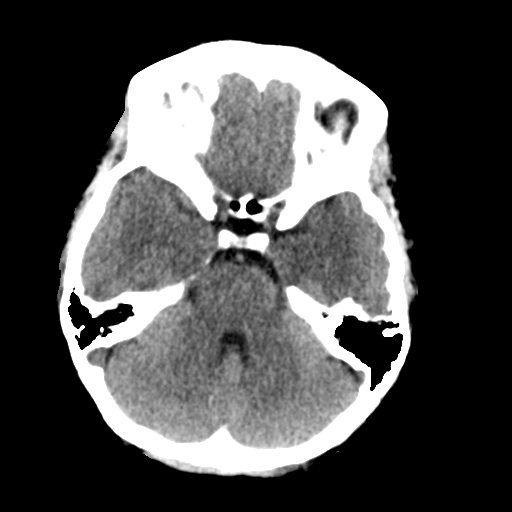
[im 12/31  brain]
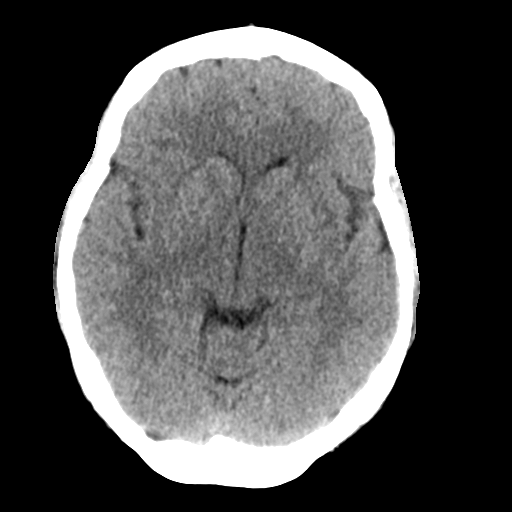
[im 16/31  brain]
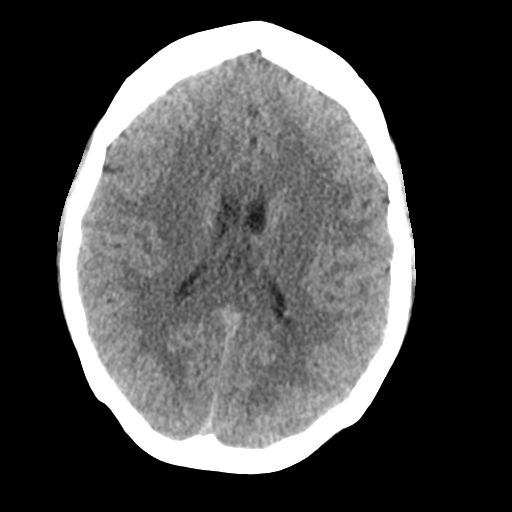
[im 19/31  brain]
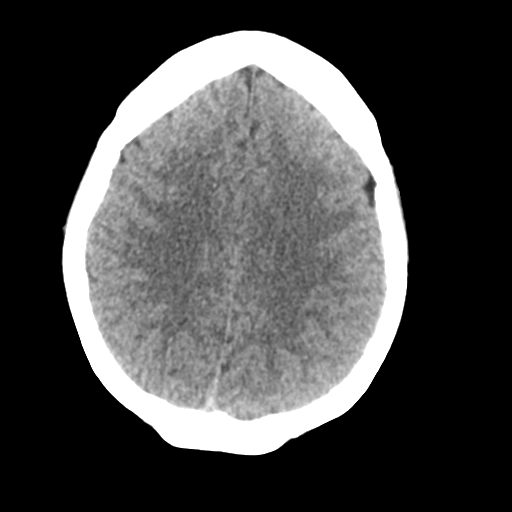
[im 19/31  bone]
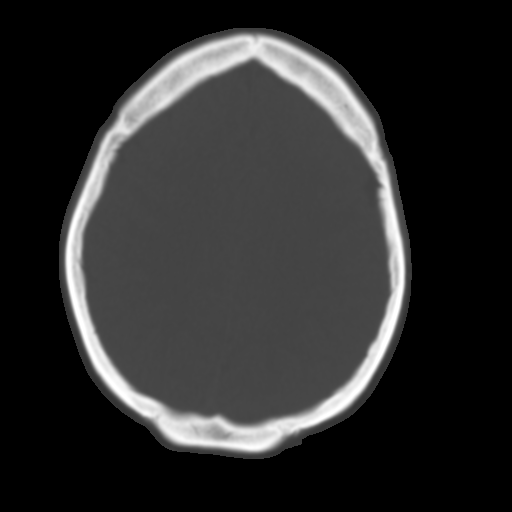
[im 23/31  brain]
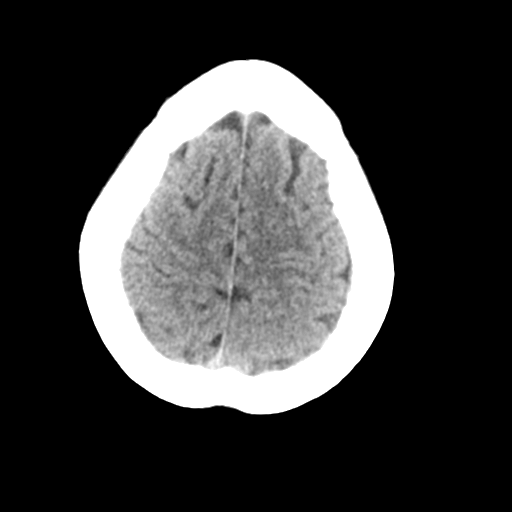
[im 27/31  brain]
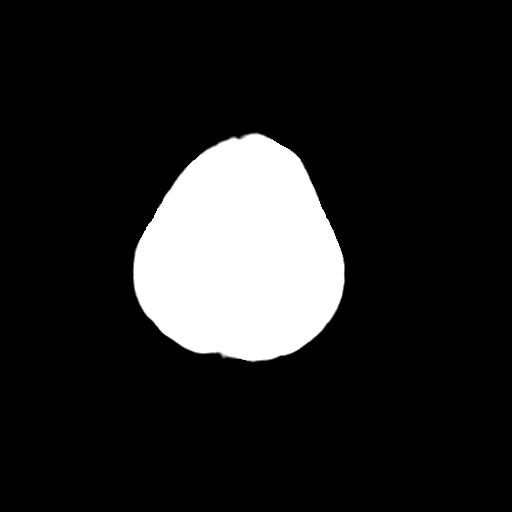

[Series 4: head bone · axial · 0.39mm/px · z∈[+1231,+1351]mm · 8 of 76 slices shown]
[im 8/76  bone]
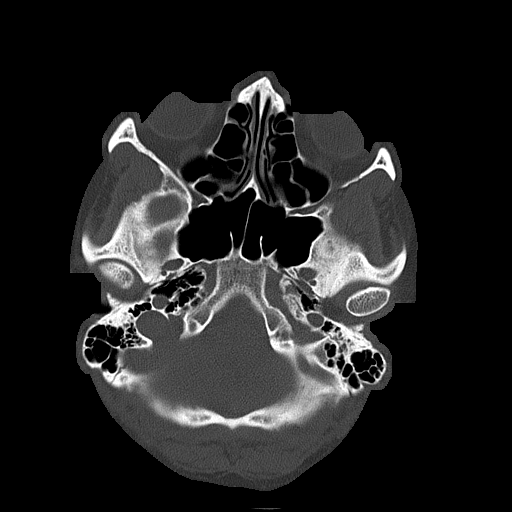
[im 16/76  bone]
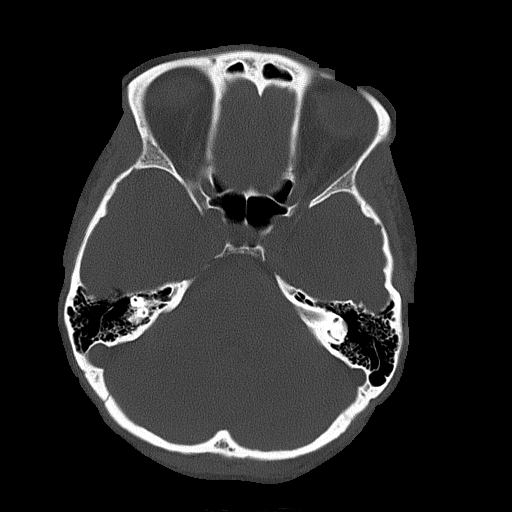
[im 23/76  bone]
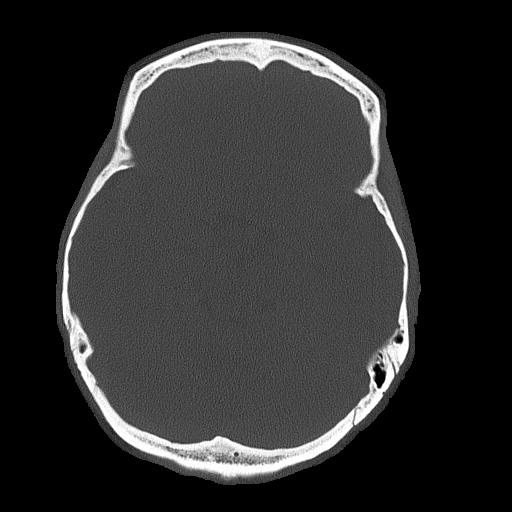
[im 34/76  bone]
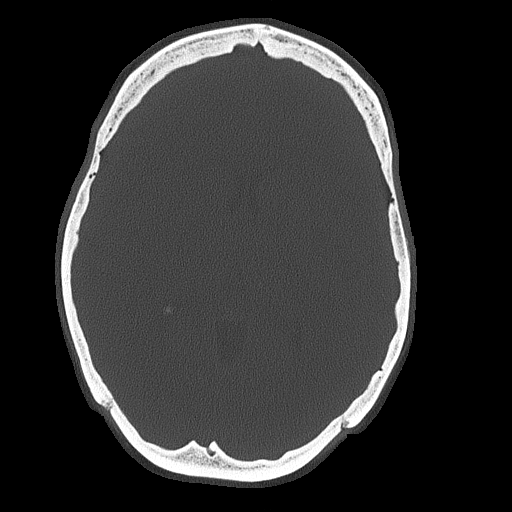
[im 42/76  bone]
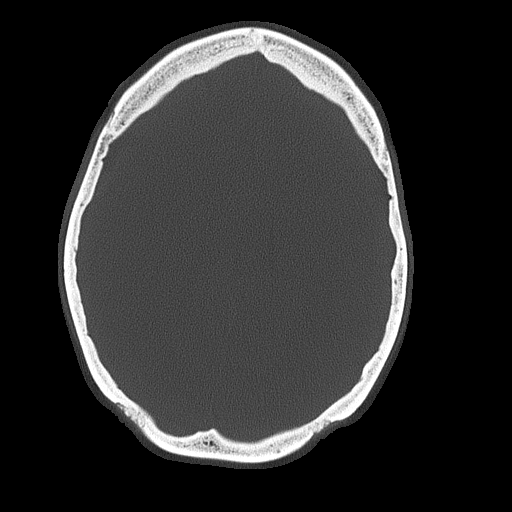
[im 53/76  bone]
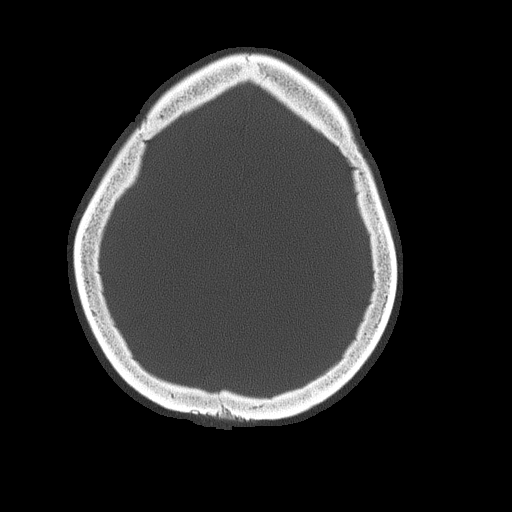
[im 61/76  bone]
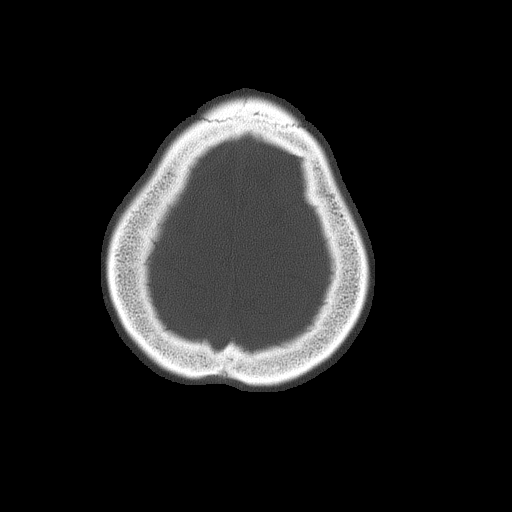
[im 68/76  bone]
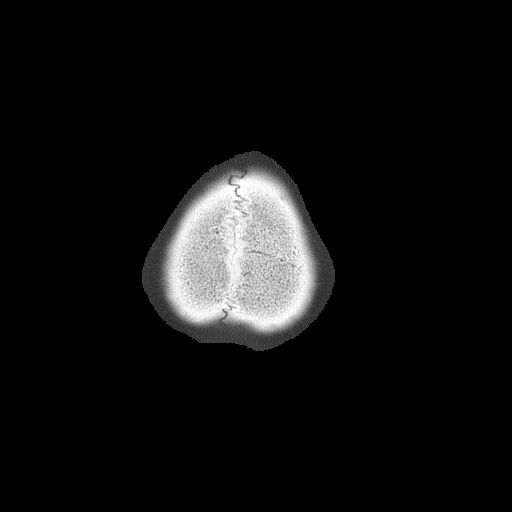

[15 of 30 positions shown; findings below may reference images not displayed]

FINDINGS: Ventricles and sulci are symmetrical. No ventricular dilatation. No
mass effect or midline shift. No abnormal extra-axial fluid
collections. Gray-white matter junctions are distinct. Basal
cisterns are not effaced. No evidence of acute intracranial
hemorrhage. No depressed skull fractures. Visualized paranasal
sinuses and mastoid air cells are not opacified.
IMPRESSION: No acute intracranial abnormalities.

## 2020-09-03 ENCOUNTER — Encounter (HOSPITAL_COMMUNITY): Payer: Self-pay | Admitting: *Deleted

## 2020-09-03 ENCOUNTER — Ambulatory Visit (HOSPITAL_COMMUNITY)
Admission: EM | Admit: 2020-09-03 | Discharge: 2020-09-03 | Disposition: A | Discharge status: Home / Self Care | Payer: Self-pay | Attending: Urgent Care | Admitting: Urgent Care

## 2020-09-03 ENCOUNTER — Other Ambulatory Visit: Payer: Self-pay

## 2020-09-03 DIAGNOSIS — R200 Anesthesia of skin: Secondary | ICD-10-CM

## 2020-09-03 DIAGNOSIS — M5412 Radiculopathy, cervical region: Secondary | ICD-10-CM

## 2020-09-03 DIAGNOSIS — M79601 Pain in right arm: Secondary | ICD-10-CM

## 2020-09-03 MED ORDER — TIZANIDINE HCL 4 MG PO TABS: 4.0000 mg | ORAL_TABLET | Freq: Three times a day (TID) | ORAL | 0 refills | Status: DC | PRN

## 2020-09-03 MED ORDER — PREDNISONE 20 MG PO TABS: ORAL_TABLET | ORAL | 0 refills | Status: DC

## 2020-09-03 NOTE — ED Provider Notes (Signed)
[facility]   MRN: [id number] DOB: 1981-01-05  Subjective:   Kristina Pace is a 39 y.o. female presenting for several day history of persistent right arm pain along the ulnar aspect, burning and tingling sensation of the third through fifth fingers of the radial side.  Patient actually states that she has pain on the right side of her neck that shoots down her arm as well.  Denies any falls, trauma.  Patient works in her own [employer].  No current facility-administered medications for this encounter. No current outpatient medications on file.   Allergies  Allergen Reactions  . Naproxen     Throat swellilng    History reviewed. No pertinent past medical history.   Past Surgical History:  Procedure Laterality Date  . CHOLECYSTECTOMY      History reviewed. No pertinent family history.  Social History   Tobacco Use  . Smoking status: Never Smoker  . Smokeless tobacco: Never Used  Vaping Use  . Vaping Use: Never used  Substance Use Topics  . Alcohol use: Not Currently  . Drug use: Not Currently    ROS   Objective:   Vitals: BP 112/79 (BP Location: Right Arm)   Pulse 60   Temp 97.8 F (36.6 C) (Oral)   Resp 20   LMP 08/04/2020   SpO2 98%   Physical Exam Constitutional:      General: She is not in acute distress.    Appearance: Normal appearance. She is well-developed. She is not ill-appearing, toxic-appearing or diaphoretic.  HENT:     Head: Normocephalic and atraumatic.     Nose: Nose normal.     Mouth/Throat:     Mouth: Mucous membranes are moist.     Pharynx: Oropharynx is clear.  Eyes:     General: No scleral icterus.       Right eye: No discharge.        Left eye: No discharge.     Extraocular Movements: Extraocular movements intact.     Conjunctiva/sclera: Conjunctivae normal.     Pupils: Pupils are equal, round, and reactive to light.  Cardiovascular:     Rate and Rhythm: Normal rate.  Pulmonary:      Effort: Pulmonary effort is normal.  Musculoskeletal:     Right shoulder: No swelling, deformity, effusion, laceration, tenderness, bony tenderness or crepitus. Normal range of motion. Normal strength.     Right upper arm: No swelling, edema, deformity, lacerations, tenderness or bony tenderness.     Right elbow: No swelling, deformity, effusion or lacerations. Normal range of motion. No tenderness.     Right forearm: No swelling, edema, deformity, lacerations, tenderness or bony tenderness.     Right wrist: No swelling, deformity, effusion, lacerations, tenderness, bony tenderness, snuff box tenderness or crepitus. Normal range of motion.     Right hand: Tenderness (3rd-5th fingers) present. No swelling, deformity, lacerations or bony tenderness. Decreased range of motion (slight). Normal strength. Normal sensation. There is no disruption of two-point discrimination. Normal capillary refill.     Cervical back: Spasms (cervical paraspinal muscles) and tenderness (right sided) present. No swelling, deformity, signs of trauma or crepitus. Normal range of motion.  Skin:    General: Skin is warm and dry.  Neurological:     General: No focal deficit present.     Mental Status: She is alert and oriented to person, place, and time.     Motor: No weakness.     Coordination: Coordination normal.  Gait: Gait normal.     Deep Tendon Reflexes: Reflexes normal.  Psychiatric:        Mood and Affect: Mood normal.        Behavior: Behavior normal.        Thought Content: Thought content normal.        Judgment: Judgment normal.      Assessment and Plan :   PDMP not reviewed this encounter.  1. Cervical radiculopathy   2. Numbness and tingling in right hand   3. Right arm pain     Start prednisone to address sx for cervical radiculopathy. Use tizanidine for muscle relaxant properties. Recommended rest from work. No suspicion for intracranial process at this time. Counseled patient on potential  for adverse effects with medications prescribed/recommended today, ER and return-to-clinic precautions discussed, patient verbalized understanding.    Wallis Bamberg, [place] 09/03/20 1637

## 2020-09-03 NOTE — ED Triage Notes (Signed)
Spanish interpreter ID # [id number]. Patient in with complaints of joint pain in fingers, hands and feet for a while that has increased for the past 2 days. Patient states that she feels hand is swollen and is unable to hold anything Pain 10-10. Patient has not taken over the counter medications. Patient states she experiencing numbness and pain from the fingers to the wrist of right hand. Patient denies any injuries.

## 2020-09-04 ENCOUNTER — Encounter (HOSPITAL_COMMUNITY): Payer: Self-pay | Admitting: Emergency Medicine

## 2021-09-26 ENCOUNTER — Ambulatory Visit: Payer: Self-pay | Admitting: Podiatry

## 2022-01-07 ENCOUNTER — Ambulatory Visit (HOSPITAL_COMMUNITY): Payer: Self-pay

## 2022-03-20 ENCOUNTER — Ambulatory Visit (INDEPENDENT_AMBULATORY_CARE_PROVIDER_SITE_OTHER): Payer: Self-pay | Admitting: Podiatry

## 2022-03-20 DIAGNOSIS — B351 Tinea unguium: Secondary | ICD-10-CM

## 2022-03-20 DIAGNOSIS — M79675 Pain in left toe(s): Secondary | ICD-10-CM

## 2022-03-20 DIAGNOSIS — L600 Ingrowing nail: Secondary | ICD-10-CM

## 2022-03-20 NOTE — Patient Instructions (Signed)
Coloque 1/4 taza de sales de Epsom en un cuarto de galn de agua tibia del grifo. Sumerja su pie o pies en la solucin y djelos en remojo durante 20 minutos. Este remojo debe hacerse dos veces al da. Luego, retire su pie o pies de la solucin, seque el rea afectada. Aplique ungento y cubra si se lo indica su mdico.  SI SU PIEL SE IRRITA MIENTRAS UTILIZA ESTAS INSTRUCCIONES, PUEDE CAMBIAR A VINAGRE BLANCO Y AGUA. Como otra alternativa de remojo, puede usar agua y jabn antibacteriano.  Controle cualquier signo/sntoma de infeccin. Llame a la oficina inmediatamente si ocurre algo o vaya directamente a la sala de emergencias. Llame con cualquier pregunta/inquietud.. 

## 2022-03-23 NOTE — Progress Notes (Signed)
Subjective:  ? ?Patient ID: Kristina Pace, female   DOB: 41 y.o.   MRN: [id number]  ? ?HPI ?41 year old female presents the office today with concerns of pain to her left big toenail pointing along the medial aspect of the nail gets ingrown.  Also she has noticed the nails become thick and discolored.  She injured the nail proximally 5 to 6 months ago the nail chipped it as it grew back in this coming ingrown.  No swelling or redness or any drainage at this time.  The nail corner is tender with pressure. ? ? ?Review of Systems  ?All other systems reviewed and are negative. ? ?No past medical history on file. ? ?Past Surgical History:  ?Procedure Laterality Date  ? CHOLECYSTECTOMY    ? ? ? ?Current Outpatient Medications:  ?  dicyclomine (BENTYL) 20 MG tablet, Take 1 tablet (20 mg total) by mouth 2 (two) times daily. (Patient not taking: Reported on 02/21/2016), Disp: 20 tablet, Rfl: 0 ?  omeprazole (PRILOSEC) 20 MG capsule, Take 1 tab daily bid for 1 week then take daily. Take at least 1 hour apart from sucralfate. (Patient not taking: Reported on 02/21/2016), Disp: 30 capsule, Rfl: 0 ?  predniSONE (DELTASONE) 20 MG tablet, Day 1-3: Take 3 tablets daily. Day 4-6: Take 2 tablets daily. Day 7-9: Take 1 tablet daily. Take medication with breakfast., Disp: 18 tablet, Rfl: 0 ?  prochlorperazine (COMPAZINE) 10 MG tablet, Take 1 tablet (10 mg total) by mouth 2 (two) times daily as needed for nausea or vomiting (headache). Take this medication with 25mg  of benadryl from over the counter, Disp: 10 tablet, Rfl: 0 ?  sucralfate (CARAFATE) 1 GM/10ML suspension, Take 10 mLs (1 g total) by mouth 4 (four) times daily. Take 30 min before meals and at bed time (Patient not taking: Reported on 02/21/2016), Disp: 420 mL, Rfl: 0 ?  tiZANidine (ZANAFLEX) 4 MG tablet, Take 1 tablet (4 mg total) by mouth every 8 (eight) hours as needed., Disp: 30 tablet, Rfl: 0 ? ?Allergies  ?Allergen Reactions  ? Naproxen Anaphylaxis  ?  Oral/tongue  swelling  ? Naproxen   ?  Throat swellilng  ? ? ? ? ? ?   ?Objective:  ?Physical Exam  ?General: AAO x3, NAD- family member present for translating  ? ?Dermatological: Incurvation present along the medial aspect of the left hallux toenail with minimal edema.  There is no significant erythema, drainage or pus or signs of infection.  In general the nail is hypertrophic, dystrophic with yellow, brown discoloration.  No open lesions. ? ?Vascular: Dorsalis Pedis artery and Posterior Tibial artery pedal pulses are 2/4 bilateral with immedate capillary fill time.  There is no pain with calf compression, swelling, warmth, erythema.  ? ?Neruologic: Grossly intact via light touch bilateral.  ? ?Musculoskeletal: Tenderness along the ingrown toenail left medial nail border.  No other areas of discomfort.  Muscular strength 5/5 in all groups tested bilateral. ? ?Gait: Unassisted, Nonantalgic.  ? ? ?   ?Assessment:  ? ?Ingrown toenail left medial nail border, onychomycosis ? ?   ?Plan:  ?-Treatment options discussed including all alternatives, risks, and complications ?-Etiology of symptoms were discussed ?-We discussed regards to treatment options for the nail.  We treated her symptoms on the medial nail border but in general the nail is hypertrophic, dystrophic.  I discussed total nail removal but she does not want proceed with.  After removing the medial nail border this heals discussed different medications to help  the overall nail health. ?-At this time, the patient is requesting partial nail removal with chemical matricectomy to the symptomatic portion of the nail. Risks and complications were discussed with the patient for which they understand and written consent was obtained. Under sterile conditions a total of 3 mL of a mixture of 2% lidocaine plain and 0.5% Marcaine plain was infiltrated in a hallux block fashion. Once anesthetized, the skin was prepped in sterile fashion. A tourniquet was then applied. Next the medial  aspect of hallux nail border was then sharply excised making sure to remove the entire offending nail border. Once the nails were ensured to be removed area was debrided and the underlying skin was intact. There is no purulence identified in the procedure. Next phenol was then applied under standard conditions and copiously irrigated. Silvadene was applied. A dry sterile dressing was applied. After application of the dressing the tourniquet was removed and there is found to be an immediate capillary refill time to the digit. The patient tolerated the procedure well any complications. Post procedure instructions were discussed the patient for which he verbally understood. Follow-up in one week for nail check or sooner if any problems are to arise. Discussed signs/symptoms of infection and directed to call the office immediately should any occur or go directly to the emergency room. In the meantime, encouraged to call the office with any questions, concerns, changes symptoms. ? ?Return in about 2 weeks (around 04/03/2022). ? ?Vivi Barrack DPM ? ? ?

## 2022-04-03 ENCOUNTER — Ambulatory Visit (INDEPENDENT_AMBULATORY_CARE_PROVIDER_SITE_OTHER): Payer: Self-pay | Admitting: Podiatry

## 2022-04-03 DIAGNOSIS — Z79899 Other long term (current) drug therapy: Secondary | ICD-10-CM

## 2022-04-03 DIAGNOSIS — L600 Ingrowing nail: Secondary | ICD-10-CM

## 2022-04-03 DIAGNOSIS — B351 Tinea unguium: Secondary | ICD-10-CM

## 2022-04-03 NOTE — Patient Instructions (Signed)
Terbinafine Tablets Qu es este medicamento? La TERBINAFINA trata las infecciones micticas de las uas. Pertenece a un grupo de medicamentos llamados antimicticos. No se utiliza para tratar infecciones causadas por bacterias o virus. Este medicamento puede ser utilizado para otros usos; si tiene alguna pregunta consulte con su proveedor de atencin mdica o con su farmacutico. MARCAS COMUNES: Lamisil, Terbinex Qu le debo informar a mi profesional de la salud antes de tomar este medicamento? Necesitan saber si usted presenta alguno de los siguientes problemas o situaciones: Enfermedad heptica Una reaccin alrgica o inusual a la terbinafina, a otros medicamentos, alimentos, colorantes o conservantes Si est embarazada o buscando quedar embarazada Si est amamantando a un beb Cmo debo utilizar este medicamento? Tome este medicamento por va oral con agua. selo segn las instrucciones en la etiqueta a la misma hora todos los das. Puede tomarlo con o sin alimentos. Si el medicamento le produce malestar estomacal, tmelo con alimentos. Siga usndolo a menos que su equipo de atencin le indique dejar de hacerlo. Su farmacutico le dar una Gua del medicamento especial (MedGuide, nombre en ingls) con cada receta y en cada ocasin que la vuelva a surtir. Asegrese de leer esta informacin cada vez cuidadosamente. Hable con su equipo de atencin sobre el uso de este medicamento en nios. Puede requerir atencin especial. Sobredosis: Pngase en contacto inmediatamente con un centro toxicolgico o una sala de urgencia si usted cree que haya tomado demasiado medicamento. ATENCIN: Este medicamento es solo para usted. No comparta este medicamento con nadie. Qu sucede si me olvido de una dosis? Si olvida una dosis, adminstrela lo antes posible, a menos que hayan pasado ms de 4 horas. Si han pasado ms de 4 horas desde el horario de administracin habitual, omita la dosis que olvidada.  Administre la prxima dosis a la hora habitual. Qu puede interactuar con este medicamento? No use este medicamento con ninguno de los siguientes productos: Pimozida Tioridazina Este medicamento tambin podra interactuar con los siguientes productos: Betabloqueadores Cafena Ciertos medicamentos para afecciones de salud mental Cimetidina Ciclosporina Medicamentos para infecciones micticas, tales como fluconazol y ketoconazol Medicamentos para la frecuencia cardiaca irregular, tales como amiodarona, flecainida y propafenona Rifampicina Warfarina Puede ser que esta lista no menciona todas las posibles interacciones. Informe a su profesional de la salud de todos los productos a base de hierbas, medicamentos de venta libre o suplementos nutritivos que est tomando. Si usted fuma, consume bebidas alcohlicas o si utiliza drogas ilegales, indqueselo tambin a su profesional de la salud. Algunas sustancias pueden interactuar con su medicamento. A qu debo estar atento al usar este medicamento? Visite a su equipo de atencin para que revise su evolucin peridicamente. Usted podra necesitar realizarse anlisis de sangre mientras est usando este medicamento. Es posible que pase un tiempo antes de que pueda notar los beneficios de este medicamento. Este medicamento podra causar reacciones graves en la piel. Pueden presentarse semanas a meses despus de comenzar a usar el medicamento. Contacte a su equipo de atencin de inmediato si nota que tiene fiebre o sntomas gripales con una erupcin. La erupcin puede ser roja o morada, y luego puede convertirse en ampollas o descamacin de la piel. O bien, es posible que observe una erupcin roja con hinchazn en la cara, los labios o los ganglios linfticos en el cuello o debajo de los brazos. Este medicamento puede aumentar su sensibilidad al sol. Evite la luz solar. Si no la puede evitar, utilice ropa protectora y crema de proteccin solar. No utilice  lmparas   solares, camas solares ni cabinas solares. Qu efectos secundarios puedo tener al utilizar este medicamento? Efectos secundarios que debe informar a su equipo de atencin tan pronto como sea posible: Reacciones alrgicas: erupcin cutnea, comezn/picazn, urticaria, hinchazn de la cara, los labios, la lengua o la garganta Cambio en el sentido del olfato Cambio en el sentido del gusto Infeccin: fiebre, escalofros, tos o dolor de garganta Lesin en el hgado: dolor en la regin abdominal superior derecha, prdida de apetito, nuseas, heces de color claro, orina amarilla oscura o marrn, color amarillento de los ojos o la piel, debilidad o fatiga inusuales Recuento bajo de glbulos rojos: debilidad o fatiga inusuales, mareo, dolor de cabeza, dificultad para respirar Sndrome similar al lupus: dolor, hinchazn o rigidez de las articulaciones, erupcin cutnea en forma de mariposa en la cara, erupciones cutneas que empeoran en el sol, fiebre, debilidad o fatiga inusuales Erupcin, fiebre y ganglios linfticos inflamados Enrojecimiento, formacin de ampollas, descamacin o distensin de la piel, incluso dentro de la boca Sangrado o moretones inusuales Empeoramiento del estado de nimo, sentimientos de depresin Efectos secundarios que generalmente no requieren atencin mdica (debe informarlos a su equipo de atencin si persisten o si son molestos): Diarrea Gases Dolor de cabeza Nuseas Dolor estomacal Malestar estomacal Puede ser que esta lista no menciona todos los posibles efectos secundarios. Comunquese a su mdico por asesoramiento mdico sobre los efectos secundarios. Usted puede informar los efectos secundarios a la FDA por telfono al 1-800-FDA-1088. Dnde debo guardar mi medicina? Mantenga fuera del alcance de nios y mascotas. Guarde a una temperatura de entre 20 y 25 grados Celsius (68 y 77 grados Fahrenheit). Proteja de la luz. Deseche todo el medicamento que no haya  utilizado despus de la fecha de vencimiento. Para desechar los medicamentos que ya no necesite o que estn vencidos: Lleve el medicamento a un programa de recuperacin de medicamentos. Consulte con su farmacia o con una entidad reguladora para encontrar un lugar donde llevarlo. Si no puede devolver el medicamento, consulte la etiqueta o el folleto de informacin para ver si debe desecharlo en la basura o arrojarlo por el sanitario. Si no est seguro, consulte con su equipo de atencin. Si es seguro colocarlo en la basura, saque el medicamento del recipiente. Mezcle el medicamento con piedras sanitarias para gatos, tierra, posos (residuos) de caf u otro desperdicio. Coloque la mezcla en una bolsa o recipiente que quede bien cerrado. Deseche en la basura. ATENCIN: Este folleto es un resumen. Puede ser que no cubra toda la posible informacin. Si usted tiene preguntas acerca de esta medicina, consulte con su mdico, su farmacutico o su profesional de la salud.  2023 Elsevier/Gold Standard (2021-09-02 00:00:00)  

## 2022-04-04 LAB — CBC WITH DIFFERENTIAL/PLATELET
Absolute Monocytes: 428 cells/uL (ref 200–950)
Basophils Absolute: 19 cells/uL (ref 0–200)
Basophils Relative: 0.3 %
Eosinophils Absolute: 93 cells/uL (ref 15–500)
Eosinophils Relative: 1.5 %
HCT: 41.7 % (ref 35.0–45.0)
Hemoglobin: 13.6 g/dL (ref 11.7–15.5)
Lymphs Abs: 2269 cells/uL (ref 850–3900)
MCH: 30.1 pg (ref 27.0–33.0)
MCHC: 32.6 g/dL (ref 32.0–36.0)
MCV: 92.3 fL (ref 80.0–100.0)
MPV: 11.4 fL (ref 7.5–12.5)
Monocytes Relative: 6.9 %
Neutro Abs: 3391 cells/uL (ref 1500–7800)
Neutrophils Relative %: 54.7 %
Platelets: 217 10*3/uL (ref 140–400)
RBC: 4.52 10*6/uL (ref 3.80–5.10)
RDW: 12.4 % (ref 11.0–15.0)
Total Lymphocyte: 36.6 %
WBC: 6.2 10*3/uL (ref 3.8–10.8)

## 2022-04-04 LAB — HEPATIC FUNCTION PANEL
AG Ratio: 1.8 (calc) (ref 1.0–2.5)
ALT: 9 U/L (ref 6–29)
AST: 13 U/L (ref 10–30)
Albumin: 4.2 g/dL (ref 3.6–5.1)
Alkaline phosphatase (APISO): 39 U/L (ref 31–125)
Bilirubin, Direct: 0.2 mg/dL (ref 0.0–0.2)
Globulin: 2.3 g/dL (calc) (ref 1.9–3.7)
Indirect Bilirubin: 0.6 mg/dL (calc) (ref 0.2–1.2)
Total Bilirubin: 0.8 mg/dL (ref 0.2–1.2)
Total Protein: 6.5 g/dL (ref 6.1–8.1)

## 2022-04-07 NOTE — Progress Notes (Signed)
Subjective: ?41 year old female presents the office today for foot evaluation after a partial nail avulsion left big toenail.  She said the procedure site is doing well and is been feeling better but she still in some soreness to the toenail as the proximal nail still somewhat thickened discolored.  She denies any drainage or pus.  She still soaking Epsom salts.  She has no other concerns today. ? ?Objective: ?AAO x3, NAD ?DP/PT pulses palpable bilaterally, CRT less than 3 seconds ?Status post partial nail avulsion of the right hallux toenail the procedure site appears to be healing well. ?Small amount of granulation tissue still present.  No edema, erythema, drainage or pus.  The nail itself on the central aspect is still hypertrophic, dystrophic with brown discoloration.  There is no drainage or pus.  No signs of infection. ?No pain with calf compression, swelling, warmth, erythema ? ?Assessment: ?Status post partial nail avulsion, onychomycosis ? ?Plan: ?-All treatment options discussed with the patient including all alternatives, risks, complications.  ?-She has continued discomfort in the nails thicker.  Discussed different medications help with the likely fungus on the nail.  She was to proceed with oral medication after discussing side effects.  While we will check a CBC and LFT prior to starting the medication and will start Lamisil we discussed side effects, success rates. ?-From the procedure standpoint continue soaking Epsom salts at least washing with soap and water daily.  Apply antibiotic ointment and a bandage daily. ?-Patient encouraged to call the office with any questions, concerns, change in symptoms.  ? ?Vivi Barrack DPM ? ?

## 2022-04-08 ENCOUNTER — Other Ambulatory Visit: Payer: Self-pay | Admitting: Podiatry

## 2022-04-08 DIAGNOSIS — Z79899 Other long term (current) drug therapy: Secondary | ICD-10-CM

## 2022-04-08 MED ORDER — TERBINAFINE HCL 250 MG PO TABS: 250.0000 mg | ORAL_TABLET | Freq: Every day | ORAL | 0 refills | Status: DC

## 2022-05-15 ENCOUNTER — Ambulatory Visit (INDEPENDENT_AMBULATORY_CARE_PROVIDER_SITE_OTHER): Payer: Self-pay | Admitting: Podiatry

## 2022-05-15 DIAGNOSIS — B351 Tinea unguium: Secondary | ICD-10-CM

## 2022-05-15 DIAGNOSIS — Z79899 Other long term (current) drug therapy: Secondary | ICD-10-CM

## 2022-05-15 DIAGNOSIS — L600 Ingrowing nail: Secondary | ICD-10-CM

## 2022-05-15 MED ORDER — TERBINAFINE HCL 250 MG PO TABS: 250.0000 mg | ORAL_TABLET | Freq: Every day | ORAL | 0 refills | Status: DC

## 2022-05-15 NOTE — Patient Instructions (Addendum)
Instrucciones de remojo  El dia despues del procedimiento:  Coloque 1/4 taza de sal de epsom en un litro de agua tibia del grifo. Sumerja su pie o pies con el vendaje externo intacto para el remojo inicial; esto permitira que el vendaje se humdezca y humedezca  para despegarlo facilmente. Una ves que retire su vendaje, continue remojando la solucion durante 20 minutos. Este remojo deber [employer] al dia. Luego, retire su pie o pies de la solucion, seque el area [place] y [place]. Puede usar una curita lo suficientemente grande como para cubrir el area o usar una gasa y Emergency planning/management officer. Aplique otros medicamentos en el area segun las indicaciones del medico, como la polisporina neosporina.    SI SU PIEL SE IRRITA AL USAR ESTAS INSTRUCCIONES, ES ACEPTABLE CAMBIARSE AL VINAGRE BLANCO Y AL AGUA. O puede usar agua y jabon antibacterial para mantener limpio el dedo del pie.   Monitoree cualquier signo/sintoma de infeccion. Llame a la oficina de inmediato si occure o vaya directament a la sal de emergencias. Llame con cualquier pregunta/inquitud.  Instrucciones de cuidado a largo plazco: [place] post clavo; Le han tratado la una encarnada y la Sharene Skeans con un quimico. [place] quimico causa una quemadura que drenara y supurara como [place]. Esto puede drenar durante 6-8 semana o mas. Es Primary school teacher esta area [place], [place] y seguir las intrucciones de remojo distribuidas al momento de la [place]. Esta area finalmente se secara y formara [place]. Una ves que se forma la [place], ya no necesita remojar o Contractor un aposito. Si en algun momento experimenta un aumento en el dolor, enrojecimiento, hinchazon o drenaje, debe comunicarse con la oficina lo antes posible.    Terbinafine Tablets What is this medication? TERBINAFINE (TER bin a feen) treats fungal infections of the nails. It belongs to a group of medications called antifungals. It will not treat infections caused by bacteria or  viruses. This medicine may be used for other purposes; ask your health care provider or pharmacist if you have questions. COMMON BRAND NAME(S): Lamisil, Terbinex What should I tell my care team before I take this medication? They need to know if you have any of these conditions: Liver disease An unusual or allergic reaction to terbinafine, other medications, foods, dyes, or preservatives Pregnant or trying to get pregnant Breast-feeding How should I use this medication? Take this medication by mouth with water. Take it as directed on the prescription label at the same time every day. You can take it with or without food. If it upsets your stomach, take it with food. Keep taking it unless your care team tells you to stop. A special MedGuide will be given to you by the pharmacist with each prescription and refill. Be sure to read this information carefully each time. Talk to your care team regarding the use of this medication in children. Special care may be needed. Overdosage: If you think you have taken too much of this medicine contact a poison control center or emergency room at once. NOTE: This medicine is only for you. Do not share this medicine with others. What if I miss a dose? If you miss a dose, take it as soon as you can unless it is more than 4 hours late. If it is more than 4 hours late, skip the missed dose. Take the next dose at the normal time. What may interact with this medication? Do not take this medication with any of the following: Pimozide Thioridazine This medication  may also interact with the following: Beta blockers Caffeine Certain medications for mental health conditions Cimetidine Cyclosporine Medications for fungal infections like fluconazole and ketoconazole Medications for irregular heartbeat like amiodarone, flecainide and propafenone Rifampin Warfarin This list may not describe all possible interactions. Give your health care provider a list of all the  medicines, herbs, non-prescription drugs, or dietary supplements you use. Also tell them if you smoke, drink alcohol, or use illegal drugs. Some items may interact with your medicine. What should I watch for while using this medication? Visit your care team for regular checks on your progress. You may need blood work while you are taking this medication. It may be some time before you see the benefit from this medication. This medication may cause serious skin reactions. They can happen weeks to months after starting the medication. Contact your care team right away if you notice fevers or flu-like symptoms with a rash. The rash may be red or purple and then turn into blisters or peeling of the skin. Or, you might notice a red rash with swelling of the face, lips or lymph nodes in your neck or under your arms. This medication can make you more sensitive to the sun. Keep out of the sun, If you cannot avoid being in the sun, wear protective clothing and sunscreen. Do not use sun lamps or tanning beds/booths. What side effects may I notice from receiving this medication? Side effects that you should report to your care team as soon as possible: Allergic reactions--skin rash, itching, hives, swelling of the face, lips, tongue, or throat Change in sense of smell Change in taste Infection--fever, chills, cough, or sore throat Liver injury--right upper belly pain, loss of appetite, nausea, light-colored stool, dark yellow or brown urine, yellowing skin or eyes, unusual weakness or fatigue Low red blood cell level--unusual weakness or fatigue, dizziness, headache, trouble breathing Lupus-like syndrome--joint pain, swelling, or stiffness, butterfly-shaped rash on the face, rashes that get worse in the sun, fever, unusual weakness or fatigue Rash, fever, and swollen lymph nodes Redness, blistering, peeling, or loosening of the skin, including inside the mouth Unusual bruising or bleeding Worsening mood,  feelings of depression Side effects that usually do not require medical attention (report to your care team if they continue or are bothersome): Diarrhea Gas Headache Nausea Stomach pain Upset stomach This list may not describe all possible side effects. Call your doctor for medical advice about side effects. You may report side effects to FDA at 1-800-FDA-1088. Where should I keep my medication? Keep out of the reach of children and pets. Store between 20 and 25 degrees C (68 and 77 degrees F). Protect from light. Get rid of any unused medication after the expiration date. To get rid of medications that are no longer needed or have expired: Take the medication to a medication take-back program. Check with your pharmacy or law enforcement to find a location. If you cannot return the medication, check the label or package insert to see if the medication should be thrown out in the garbage or flushed down the toilet. If you are not sure, ask your care team. If it is safe to put it in the trash, take the medication out of the container. Mix the medication with cat litter, dirt, coffee grounds, or other unwanted substance. Seal the mixture in a bag or container. Put it in the trash. NOTE: This sheet is a summary. It may not cover all possible information. If you have questions about this  medicine, talk to your doctor, pharmacist, or health care provider.  2023 Elsevier/Gold Standard (2021-07-03 00:00:00)

## 2022-05-17 NOTE — Progress Notes (Signed)
Subjective: 41 year old female presents the office today for follow evaluation of fungus on her left big toenail.  She did not pick up the Lamisil.  She is concerned that the entire nail nail is discolored.  There is also starting to left.  No swelling redness or any drainage.  Objective: AAO x3, NAD-presents with translator DP/PT pulses palpable bilaterally, CRT less than 3 seconds Left hallux toenail is loose with underlying nailbed of proximal nail fold but.  Distally.  There is discoloration with yellow discoloration entire toenail.  There is no edema, erythema or signs of infection. No pain with calf compression, swelling, warmth, erythema     Assessment: Right hallux onychomycosis, onycholysis  Plan: -All treatment options discussed with the patient including all alternatives, risks, complications.  -Discussed other treatment options and she wants to have the nail removed.  Discussed that nail removal by itself is not a guarantee that the nails are coming better and there is a chance it does not come back from all or works.  She understands this and wishes to proceed. -At this time, patient is requesting total nail removal without chemical matricectomy to the left hallux. Risks and complications were discussed with the patient for which they understand and  verbally consent to the procedure. Under sterile conditions a total of 3 mL of a mixture of 2% lidocaine plain and 0.5% Marcaine plain was infiltrated in a hallux block fashion. Once anesthetized, the skin was prepped in sterile fashion. A tourniquet was then applied. Next the left hallux nail was removed in total making sure remove all all nail borders.  Once the nail was removed, the area was debrided and the underlying skin was intact. The area was irrigated and hemostasis was obtained.  A dry sterile dressing was applied. After application of the dressing the tourniquet was removed and there is found to be an immediate capillary refill  time to the digit. The patient tolerated the procedure well any complications. Post procedure instructions were discussed the patient for which he verbally understood.  Discussed signs/symptoms of worsening infection and directed to call the office immediately should any occur or go directly to the emergency room. In the meantime, encouraged to call the office with any questions, concerns, changes symptoms. -She is going to start Lamisil to help the nail coming better.  We will recheck blood work in 6 weeks which was given to her today.  Monitor any side effects and let me know immediately should any occur. -Patient encouraged to call the office with any questions, concerns, change in symptoms.   Vivi Barrack DPM

## 2022-12-02 ENCOUNTER — Encounter (HOSPITAL_COMMUNITY): Payer: Self-pay | Admitting: *Deleted

## 2022-12-02 ENCOUNTER — Ambulatory Visit (HOSPITAL_COMMUNITY)
Admission: EM | Admit: 2022-12-02 | Discharge: 2022-12-02 | Disposition: A | Discharge status: Home / Self Care | Payer: Self-pay | Attending: Internal Medicine | Admitting: Internal Medicine

## 2022-12-02 ENCOUNTER — Other Ambulatory Visit: Payer: Self-pay

## 2022-12-02 DIAGNOSIS — N10 Acute tubulo-interstitial nephritis: Secondary | ICD-10-CM | POA: Insufficient documentation

## 2022-12-02 DIAGNOSIS — B349 Viral infection, unspecified: Secondary | ICD-10-CM | POA: Insufficient documentation

## 2022-12-02 LAB — COMPREHENSIVE METABOLIC PANEL
ALT: 26 U/L (ref 0–44)
AST: 25 U/L (ref 15–41)
Albumin: 2.8 g/dL — ABNORMAL LOW (ref 3.5–5.0)
Alkaline Phosphatase: 110 U/L (ref 38–126)
Anion gap: 12 (ref 5–15)
BUN: 13 mg/dL (ref 6–20)
CO2: 21 mmol/L — ABNORMAL LOW (ref 22–32)
Calcium: 8.4 mg/dL — ABNORMAL LOW (ref 8.9–10.3)
Chloride: 102 mmol/L (ref 98–111)
Creatinine, Ser: 1.37 mg/dL — ABNORMAL HIGH (ref 0.44–1.00)
GFR, Estimated: 49 mL/min — ABNORMAL LOW (ref >60)
Glucose, Bld: 140 mg/dL — ABNORMAL HIGH (ref 70–99)
Potassium: 4 mmol/L (ref 3.5–5.1)
Sodium: 135 mmol/L (ref 135–145)
Total Bilirubin: 0.8 mg/dL (ref 0.3–1.2)
Total Protein: 6.6 g/dL (ref 6.5–8.1)

## 2022-12-02 LAB — CBC WITH DIFFERENTIAL/PLATELET
Abs Immature Granulocytes: 0 10*3/uL (ref 0.00–0.07)
Basophils Absolute: 0.1 10*3/uL (ref 0.0–0.1)
Basophils Relative: 1 %
Eosinophils Absolute: 0 10*3/uL (ref 0.0–0.5)
Eosinophils Relative: 0 %
HCT: 39.1 % (ref 36.0–46.0)
Hemoglobin: 12.6 g/dL (ref 12.0–15.0)
Lymphocytes Relative: 6 %
Lymphs Abs: 0.7 10*3/uL (ref 0.7–4.0)
MCH: 30.1 pg (ref 26.0–34.0)
MCHC: 32.2 g/dL (ref 30.0–36.0)
MCV: 93.3 fL (ref 80.0–100.0)
Monocytes Absolute: 0.8 10*3/uL (ref 0.1–1.0)
Monocytes Relative: 7 %
Neutro Abs: 9.8 10*3/uL — ABNORMAL HIGH (ref 1.7–7.7)
Neutrophils Relative %: 86 %
Platelets: 189 10*3/uL (ref 150–400)
RBC: 4.19 MIL/uL (ref 3.87–5.11)
RDW: 13.4 % (ref 11.5–15.5)
WBC: 11.4 10*3/uL — ABNORMAL HIGH (ref 4.0–10.5)
nRBC: 0 % (ref 0.0–0.2)
nRBC: 0 /100 WBC

## 2022-12-02 LAB — LIPASE, BLOOD: Lipase: 20 U/L (ref 11–51)

## 2022-12-02 LAB — POCT URINALYSIS DIPSTICK, ED / UC
Bilirubin Urine: NEGATIVE
Glucose, UA: NEGATIVE mg/dL
Ketones, ur: NEGATIVE mg/dL
Nitrite: POSITIVE — AB
Protein, ur: 100 mg/dL — AB
Specific Gravity, Urine: <=1.005 (ref 1.005–1.030)
Urobilinogen, UA: 0.2 mg/dL (ref 0.0–1.0)
pH: 5.5 (ref 5.0–8.0)

## 2022-12-02 MED ORDER — LIDOCAINE HCL (PF) 1 % IJ SOLN
INTRAMUSCULAR | Status: AC
  Filled 2022-12-02: qty 2

## 2022-12-02 MED ORDER — CEFTRIAXONE SODIUM 500 MG IJ SOLR
500.0000 mg | INTRAMUSCULAR | Status: DC
  Administered 2022-12-02: 500 mg via INTRAMUSCULAR

## 2022-12-02 MED ORDER — CEFTRIAXONE SODIUM 500 MG IJ SOLR: 500.0000 mg | Freq: Once | INTRAMUSCULAR | Status: AC

## 2022-12-02 MED ORDER — CEFTRIAXONE SODIUM 500 MG IJ SOLR
INTRAMUSCULAR | Status: AC
  Filled 2022-12-02: qty 500

## 2022-12-02 MED ORDER — CIPROFLOXACIN HCL 500 MG PO TABS: 500.0000 mg | ORAL_TABLET | Freq: Two times a day (BID) | ORAL | 0 refills | Status: DC

## 2022-12-02 NOTE — Discharge Instructions (Addendum)
Ciprofloxacin has been sent to the pharmacy, this is the antibiotic you take 2 times daily for the next 7 days.   We will call you if any of your test results warrant a change in your plan of care.  Please make sure to continue staying adequately hydrated, drinking at least 8 cups of water daily.   If you develop any severe/concerning symptoms please go to the nearest Emergency Department.

## 2022-12-02 NOTE — ED Provider Notes (Signed)
[place]    CSN: [id number] Arrival date & time: 12/02/22  1306      History   Chief Complaint Chief Complaint  Patient presents with   Abdominal Pain    HPI Arica Proefrock is a 42 y.o. female.  Patient presents complaining of right upper and left upper abdominal pain and bilateral flank pain that started approximately 6 days ago.  She reports upon onset of symptoms she had dysuria which has now resolved.  She reports that her pain has progressively worsened.  She reports nausea and loss of appetite.  She denies any pertinent medical history.  She tested positive for COVID-19 on 11/28/2022.  She has had some viral symptoms such as fever and nonproductive cough.  She was uncertain if her abdominal pain symptoms were related to her COVID symptoms.  She denies any diarrhea.   Spanish interpretation used.    Abdominal Pain Associated symptoms: chills, cough, dysuria (Upon onset, has now resolved.), fatigue, fever and nausea   Associated symptoms: no chest pain, no constipation, no hematuria, no shortness of breath and no vomiting     History reviewed. No pertinent past medical history.  There are no problems to display for this patient.   Past Surgical History:  Procedure Laterality Date   CHOLECYSTECTOMY      OB History   No obstetric history on file.      Home Medications    Prior to Admission medications   Medication Sig Start Date End Date Taking? Authorizing Provider  ciprofloxacin (CIPRO) 500 MG tablet Take 1 tablet (500 mg total) by mouth every 12 (twelve) hours. 12/02/22  Yes Flossie Dibble, NP    Family History History reviewed. No pertinent family history.  Social History Social History   Tobacco Use   Smoking status: Never   Smokeless tobacco: Never  Vaping Use   Vaping Use: Never used  Substance Use Topics   Alcohol use: Not Currently   Drug use: Not Currently     Allergies   Naproxen and Naproxen   Review of  Systems Review of Systems  Constitutional:  Positive for activity change, appetite change, chills, fatigue and fever.  HENT: Negative.    Eyes: Negative.   Respiratory:  Positive for cough. Negative for chest tightness and shortness of breath.   Cardiovascular:  Negative for chest pain and palpitations.  Gastrointestinal:  Positive for abdominal pain and nausea. Negative for constipation and vomiting.  Genitourinary:  Positive for dysuria (Upon onset, has now resolved.). Negative for decreased urine volume, difficulty urinating, frequency, hematuria, pelvic pain and urgency.  Musculoskeletal:  Positive for myalgias.  Neurological:  Positive for light-headedness.     Physical Exam Triage Vital Signs ED Triage Vitals  Enc Vitals Group     BP 12/02/22 1625 114/75     Pulse Rate 12/02/22 1625 (!) 111     Resp 12/02/22 1625 20     Temp 12/02/22 1625 99.4 F (37.4 C)     Temp src --      SpO2 12/02/22 1625 97 %     Weight --      Height --      Head Circumference --      Peak Flow --      Pain Score 12/02/22 1623 10     Pain Loc --      Pain Edu? --      Excl. in [place]? --    No data found.  Updated Vital Signs BP  114/75   Pulse (!) 111   Temp 99.4 F (37.4 C)   Resp 20   LMP 12/02/2022   SpO2 97%      Physical Exam Vitals and nursing note reviewed.  Constitutional:      Appearance: She is ill-appearing.  Cardiovascular:     Rate and Rhythm: Normal rate and regular rhythm.     Heart sounds: Normal heart sounds, S1 normal and S2 normal.  Pulmonary:     Effort: Pulmonary effort is normal.     Breath sounds: Normal breath sounds and air entry. No decreased breath sounds, wheezing, rhonchi or rales.  Abdominal:     General: Abdomen is flat. There is no distension.     Palpations: Abdomen is soft. There is no shifting dullness, fluid wave, hepatomegaly, splenomegaly, mass or pulsatile mass.     Tenderness: There is abdominal tenderness in the right upper quadrant and left  upper quadrant. There is right CVA tenderness and left CVA tenderness. There is no guarding or rebound. Negative signs include Murphy's sign and McBurney's sign.  Neurological:     Mental Status: She is alert.      UC Treatments / Results  Labs (all labs ordered are listed, but only abnormal results are displayed) Labs Reviewed  POCT URINALYSIS DIPSTICK, ED / UC - Abnormal; Notable for the following components:      Result Value   Hgb urine dipstick MODERATE (*)    Protein, ur 100 (*)    Nitrite POSITIVE (*)    Leukocytes,Ua MODERATE (*)    All other components within normal limits  URINE CULTURE  COMPREHENSIVE METABOLIC PANEL  CBC WITH DIFFERENTIAL/PLATELET  LIPASE, BLOOD    EKG   Radiology No results found.  Procedures Procedures (including critical care time)  Medications Ordered in UC Medications  cefTRIAXone (ROCEPHIN) injection 500 mg (0 mg Intramuscular Duplicate 01/06/84 2778)    Initial Impression / Assessment and Plan / UC Course  I have reviewed the triage vital signs and the nursing notes.  Pertinent labs & imaging results that were available during my care of the patient were reviewed by me and considered in my medical decision making (see chart for details).     Patient was evaluated for acute pyelonephritis and viral illness.  Etiology of urinary symptoms and ABD pain seem to be related to pyelonephritis based on physical assessment and symptomology.  Patient declined Zofran.  Urinalysis showed moderate blood, protein 100, positive nitrites, and moderate leukocytes.  Urine culture ordered for appropriate antimicrobial treatment.  CBC, CMP, and lipase ordered, to assess white blood cell count, liver function, kidney function, electrolytes, and pancreatic enzymes.  Patient was made aware of symptom management of a viral illness and the importance of hydration with both a viral illness and pyelonephritis occurring simultaneously.  Rocephin injection given in  office.  Ciprofloxacin was sent to the pharmacy, patient was made aware of treatment regiment.  Patient and patient's daughter was made aware of red flag symptoms that warrant an immediate emergency department visit.Patient made aware of timeline for symptom resolution and when follow-up would be necessary.  Patient made aware of results reporting protocol and MyChart.  Patient verbalized understanding of instructions.    Charting was provided using a a verbal dictation system, charting was proofread for errors, errors may occur which could change the meaning of the information charted.   Final Clinical Impressions(s) / UC Diagnoses   Final diagnoses:  Acute pyelonephritis  Viral illness  Discharge Instructions      Ciprofloxacin has been sent to the pharmacy, this is the antibiotic you take 2 times daily for the next 7 days.   We will call you if any of your test results warrant a change in your plan of care.  Please make sure to continue staying adequately hydrated, drinking at least 8 cups of water daily.   If you develop any severe/concerning symptoms please go to the nearest Emergency Department.      ED Prescriptions     Medication Sig Dispense Auth. Provider   ciprofloxacin (CIPRO) 500 MG tablet Take 1 tablet (500 mg total) by mouth every 12 (twelve) hours. 14 tablet Flossie Dibble, NP      PDMP not reviewed this encounter.   Flossie Dibble, NP 12/02/22 1758

## 2022-12-02 NOTE — ED Triage Notes (Signed)
Pt has had ABD pain since 11-28-22. Pt reports Pain starts in front and spreads around bil flank to back. Pt tested Positive for COVID with a home test on 11-28-22.

## 2022-12-03 ENCOUNTER — Emergency Department (HOSPITAL_COMMUNITY): Payer: Self-pay

## 2022-12-03 ENCOUNTER — Encounter (HOSPITAL_COMMUNITY): Payer: Self-pay

## 2022-12-03 ENCOUNTER — Other Ambulatory Visit: Payer: Self-pay

## 2022-12-03 ENCOUNTER — Emergency Department (HOSPITAL_COMMUNITY)
Admission: EM | Admit: 2022-12-03 | Discharge: 2022-12-04 | Disposition: A | Discharge status: Home / Self Care | Payer: Self-pay | Attending: Emergency Medicine | Admitting: Emergency Medicine

## 2022-12-03 DIAGNOSIS — R000 Tachycardia, unspecified: Secondary | ICD-10-CM | POA: Insufficient documentation

## 2022-12-03 DIAGNOSIS — N12 Tubulo-interstitial nephritis, not specified as acute or chronic: Secondary | ICD-10-CM | POA: Insufficient documentation

## 2022-12-03 LAB — URINALYSIS, ROUTINE W REFLEX MICROSCOPIC
Bilirubin Urine: NEGATIVE
Glucose, UA: NEGATIVE mg/dL
Ketones, ur: NEGATIVE mg/dL
Nitrite: NEGATIVE
Protein, ur: NEGATIVE mg/dL
Specific Gravity, Urine: 1.005 (ref 1.005–1.030)
pH: 5 (ref 5.0–8.0)

## 2022-12-03 LAB — CBC WITH DIFFERENTIAL/PLATELET
Abs Immature Granulocytes: 0 10*3/uL (ref 0.00–0.07)
Basophils Absolute: 0 10*3/uL (ref 0.0–0.1)
Basophils Relative: 0 %
Eosinophils Absolute: 0.1 10*3/uL (ref 0.0–0.5)
Eosinophils Relative: 1 %
HCT: 36 % (ref 36.0–46.0)
Hemoglobin: 11.8 g/dL — ABNORMAL LOW (ref 12.0–15.0)
Lymphocytes Relative: 14 %
Lymphs Abs: 1.7 10*3/uL (ref 0.7–4.0)
MCH: 30.2 pg (ref 26.0–34.0)
MCHC: 32.8 g/dL (ref 30.0–36.0)
MCV: 92.1 fL (ref 80.0–100.0)
Monocytes Absolute: 1.2 10*3/uL — ABNORMAL HIGH (ref 0.1–1.0)
Monocytes Relative: 10 %
Neutro Abs: 9 10*3/uL — ABNORMAL HIGH (ref 1.7–7.7)
Neutrophils Relative %: 75 %
Platelets: 194 10*3/uL (ref 150–400)
RBC: 3.91 MIL/uL (ref 3.87–5.11)
RDW: 13.4 % (ref 11.5–15.5)
WBC: 12 10*3/uL — ABNORMAL HIGH (ref 4.0–10.5)
nRBC: 0 % (ref 0.0–0.2)
nRBC: 0 /100 WBC

## 2022-12-03 LAB — COMPREHENSIVE METABOLIC PANEL
ALT: 24 U/L (ref 0–44)
AST: 21 U/L (ref 15–41)
Albumin: 2.7 g/dL — ABNORMAL LOW (ref 3.5–5.0)
Alkaline Phosphatase: 112 U/L (ref 38–126)
Anion gap: 12 (ref 5–15)
BUN: 17 mg/dL (ref 6–20)
CO2: 17 mmol/L — ABNORMAL LOW (ref 22–32)
Calcium: 8.1 mg/dL — ABNORMAL LOW (ref 8.9–10.3)
Chloride: 102 mmol/L (ref 98–111)
Creatinine, Ser: 1.3 mg/dL — ABNORMAL HIGH (ref 0.44–1.00)
GFR, Estimated: 53 mL/min — ABNORMAL LOW (ref >60)
Glucose, Bld: 135 mg/dL — ABNORMAL HIGH (ref 70–99)
Potassium: 3.6 mmol/L (ref 3.5–5.1)
Sodium: 131 mmol/L — ABNORMAL LOW (ref 135–145)
Total Bilirubin: 0.9 mg/dL (ref 0.3–1.2)
Total Protein: 6.1 g/dL — ABNORMAL LOW (ref 6.5–8.1)

## 2022-12-03 LAB — I-STAT BETA HCG BLOOD, ED (MC, WL, AP ONLY): I-stat hCG, quantitative: <5 m[IU]/mL (ref <5)

## 2022-12-03 LAB — LIPASE, BLOOD: Lipase: 23 U/L (ref 11–51)

## 2022-12-03 MED ORDER — IOHEXOL 350 MG/ML SOLN
75.0000 mL | Freq: Once | INTRAVENOUS | Status: AC | PRN
  Administered 2022-12-03: 75 mL via INTRAVENOUS

## 2022-12-03 NOTE — ED Triage Notes (Signed)
Reports upper abdominal pain since 12/29. Covid+ from home test on 12/29 also. Pt thought it was an expected sx of the virus.   Seen at [facility] and told she had a UTI but unable to properly evaluate abdomen. Shot of Ceftriaxone given in office and Cipro sent to pharmacy.

## 2022-12-03 NOTE — ED Provider Triage Note (Signed)
  Emergency Medicine Provider Triage Evaluation Note  MRN:  [id number]  Arrival date & time: 12/03/22    Medically screening exam initiated at 2:05 AM.   CC:   Abdominal Pain   HPI:  Kristina Pace is a 42 y.o. year-old female presents to the ED with chief complaint of upper abdominal pain.  R>L.  Radiates to her back.  Associated nausea.  Had dysuria prior to this.  Diagnosed with UTI by [facility].  History provided by provider ROS:  -As included in HPI PE:   Vitals:   12/03/22 0156  BP: 105/76  Pulse: (!) 103  Resp: 18  Temp: 98.2 F (36.8 C)  SpO2: 100%    Non-toxic appearing No respiratory distress tachycardic MDM:  Based on signs and symptoms, gallbladder dz is highest on my differential, followed by pyelo. I've ordered labs and imaging in triage to expedite lab/diagnostic workup.  Patient was informed that the remainder of the evaluation will be completed by another provider, this initial triage assessment does not replace that evaluation, and the importance of remaining in the ED until their evaluation is complete.    Montine Circle, PA-C 12/03/22 0210

## 2022-12-04 LAB — URINE CULTURE: Special Requests: NORMAL

## 2022-12-04 MED ORDER — ONDANSETRON HCL 4 MG/2ML IJ SOLN
4.0000 mg | Freq: Once | INTRAMUSCULAR | Status: AC
  Administered 2022-12-04: 4 mg via INTRAVENOUS
  Filled 2022-12-04: qty 2

## 2022-12-04 MED ORDER — FENTANYL CITRATE PF 50 MCG/ML IJ SOSY
50.0000 ug | PREFILLED_SYRINGE | Freq: Once | INTRAMUSCULAR | Status: AC
  Administered 2022-12-04: 50 ug via INTRAVENOUS
  Filled 2022-12-04: qty 1

## 2022-12-04 MED ORDER — CEPHALEXIN 500 MG PO CAPS: 500.0000 mg | ORAL_CAPSULE | Freq: Four times a day (QID) | ORAL | 0 refills | Status: AC

## 2022-12-04 MED ORDER — ONDANSETRON 4 MG PO TBDP: ORAL_TABLET | ORAL | 0 refills | Status: AC

## 2022-12-04 MED ORDER — OXYCODONE-ACETAMINOPHEN 5-325 MG PO TABS: 1.0000 | ORAL_TABLET | Freq: Four times a day (QID) | ORAL | 0 refills | Status: AC | PRN

## 2022-12-04 MED ORDER — SODIUM CHLORIDE 0.9 % IV BOLUS
1000.0000 mL | Freq: Once | INTRAVENOUS | Status: AC
  Administered 2022-12-04: 1000 mL via INTRAVENOUS

## 2022-12-04 MED ORDER — SODIUM CHLORIDE 0.9 % IV SOLN
2.0000 g | Freq: Once | INTRAVENOUS | Status: AC
  Administered 2022-12-04: 2 g via INTRAVENOUS
  Filled 2022-12-04: qty 20

## 2022-12-05 LAB — URINE CULTURE: Culture: >=100000 — AB

## 2022-12-05 NOTE — ED Provider Notes (Signed)
[place] EMERGENCY DEPARTMENT Provider Note   CSN: [id number] Arrival date & time: 12/03/22  0136     History  Chief Complaint  Patient presents with   Abdominal Pain    Kristina Pace is a 42 y.o. female.  Diagnosed with a UTI few days ago after a couple days of dysuria.  Having back pain, abdominal pain and nausea over the last 24 hours.  Some chills.  No diarrhea or constipation.  No other associated symptoms.  Has been on ciprofloxacin at home.  She wonders if that might be related to some of her symptoms as well.   Abdominal Pain      Home Medications Prior to Admission medications   Medication Sig Start Date End Date Taking? Authorizing Provider  cephALEXin (KEFLEX) 500 MG capsule Take 1 capsule (500 mg total) by mouth 4 (four) times daily for 14 days. 12/04/22 12/18/22 Yes Larhonda Dettloff, Corene Cornea, MD  ondansetron (ZOFRAN-ODT) 4 MG disintegrating tablet 4mg  ODT q4 hours prn nausea/vomit 12/04/22  Yes Tommie Dejoseph, Corene Cornea, MD  oxyCODONE-acetaminophen (PERCOCET) 5-325 MG tablet Take 1 tablet by mouth every 6 (six) hours as needed for severe pain. 12/04/22  Yes Erick Murin, Corene Cornea, MD      Allergies    Naproxen and Naproxen    Review of Systems   Review of Systems  Gastrointestinal:  Positive for abdominal pain.    Physical Exam Updated Vital Signs BP 103/77   Pulse 94   Temp 98.3 F (36.8 C) (Oral)   Resp 16   Ht 5\' 4"  (1.626 m)   Wt 66.7 kg   LMP 12/02/2022   SpO2 98%   BMI 25.23 kg/m  Physical Exam Vitals and nursing note reviewed.  Constitutional:      Appearance: She is well-developed.  HENT:     Head: Normocephalic and atraumatic.  Cardiovascular:     Rate and Rhythm: Normal rate and regular rhythm.  Pulmonary:     Effort: Pulmonary effort is normal. No respiratory distress.     Breath sounds: No stridor.  Abdominal:     General: There is no distension.  Musculoskeletal:     Cervical back: Normal range of motion.  Skin:    General: Skin is  warm.  Neurological:     Mental Status: She is alert.     ED Results / Procedures / Treatments   Labs (all labs ordered are listed, but only abnormal results are displayed) Labs Reviewed  COMPREHENSIVE METABOLIC PANEL - Abnormal; Notable for the following components:      Result Value   Sodium 131 (*)    CO2 17 (*)    Glucose, Bld 135 (*)    Creatinine, Ser 1.30 (*)    Calcium 8.1 (*)    Total Protein 6.1 (*)    Albumin 2.7 (*)    GFR, Estimated 53 (*)    All other components within normal limits  CBC WITH DIFFERENTIAL/PLATELET - Abnormal; Notable for the following components:   WBC 12.0 (*)    Hemoglobin 11.8 (*)    Neutro Abs 9.0 (*)    Monocytes Absolute 1.2 (*)    All other components within normal limits  URINALYSIS, ROUTINE W REFLEX MICROSCOPIC - Abnormal; Notable for the following components:   APPearance HAZY (*)    Hgb urine dipstick SMALL (*)    Leukocytes,Ua LARGE (*)    Bacteria, UA FEW (*)    All other components within normal limits  URINE CULTURE  LIPASE, BLOOD  I-STAT  BETA HCG BLOOD, ED ([facility], [facility], [facility] ONLY)    EKG EKG Interpretation  Date/Time:  Wednesday December 03 2022 01:47:50 EST Ventricular Rate:  105 PR Interval:  154 QRS Duration: 100 QT Interval:  352 QTC Calculation: 465 R Axis:   10 Text Interpretation: Sinus tachycardia Incomplete right bundle branch block Borderline ECG When compared with ECG of 30-Sep-2010 09:31, HEART RATE has increased Confirmed by Delora Fuel ([id number]) on 12/03/2022 2:21:28 AM  Radiology No results found.  Procedures Procedures    Medications Ordered in ED Medications  iohexol (OMNIPAQUE) 350 MG/ML injection 75 mL (75 mLs Intravenous Contrast Given 12/03/22 0328)  cefTRIAXone (ROCEPHIN) 2 g in sodium chloride 0.9 % 100 mL IVPB (0 g Intravenous Stopped 12/04/22 0529)  sodium chloride 0.9 % bolus 1,000 mL (0 mLs Intravenous Stopped 12/04/22 0639)  fentaNYL (SUBLIMAZE) injection 50 mcg (50 mcg Intravenous Given 12/04/22  0437)  ondansetron (ZOFRAN) injection 4 mg (4 mg Intravenous Given 12/04/22 8185)    ED Course/ Medical Decision Making/ A&P                           Medical Decision Making Amount and/or Complexity of Data Reviewed Labs: ordered.  Risk Prescription drug management.   Overall patient appears well.  Not septic appearing low suspicion for bacteremia.  Will switch from Cipro to Keflex in case this improves with related to some of her new symptoms however suspect this more pyelonephritis.  Patient observed in the ER under my care for few hours without any worsening symptoms and she felt much better at time of discharge.  Will discharge on antibiotics, pain meds and nausea meds.  PCP follow-up to ensure improvement otherwise return here for new or worsening symptoms.  CT abdomen pelvis done and showed mild kidney stranding, radiology notes concern for pyelonephritis (independently viewed and interpreted by myself and radiology read reviewed).  Final Clinical Impression(s) / ED Diagnoses Final diagnoses:  Pyelonephritis    Rx / DC Orders ED Discharge Orders          Ordered    cephALEXin (KEFLEX) 500 MG capsule  4 times daily        12/04/22 0559    ondansetron (ZOFRAN-ODT) 4 MG disintegrating tablet        12/04/22 0559    oxyCODONE-acetaminophen (PERCOCET) 5-325 MG tablet  Every 6 hours PRN        12/04/22 0559              Sheron Tallman, Corene Cornea, MD 12/05/22 [phone]

## 2024-09-21 ENCOUNTER — Other Ambulatory Visit: Payer: Self-pay | Admitting: Nurse Practitioner

## 2024-09-21 DIAGNOSIS — Z1231 Encounter for screening mammogram for malignant neoplasm of breast: Secondary | ICD-10-CM
